# Patient Record
Sex: Female | Born: 1975 | Race: White | Hispanic: No | Marital: Single | Smoking: Never smoker
Health system: Southern US, Community
[De-identification: ages and names within clinical notes are randomized; demographics above are authoritative.]

## PROBLEM LIST (undated history)

## (undated) DIAGNOSIS — E7800 Pure hypercholesterolemia, unspecified: Secondary | ICD-10-CM

## (undated) DIAGNOSIS — F39 Unspecified mood [affective] disorder: Secondary | ICD-10-CM

## (undated) DIAGNOSIS — N943 Premenstrual tension syndrome: Secondary | ICD-10-CM

## (undated) DIAGNOSIS — F79 Unspecified intellectual disabilities: Secondary | ICD-10-CM

## (undated) DIAGNOSIS — E119 Type 2 diabetes mellitus without complications: Secondary | ICD-10-CM

---

## 2001-11-16 ENCOUNTER — Encounter: Admission: RE | Admit: 2001-11-16 | Discharge: 2002-02-14 | Payer: Self-pay

## 2017-01-16 ENCOUNTER — Emergency Department (HOSPITAL_BASED_OUTPATIENT_CLINIC_OR_DEPARTMENT_OTHER): Payer: Medicare Other

## 2017-01-16 ENCOUNTER — Encounter (HOSPITAL_BASED_OUTPATIENT_CLINIC_OR_DEPARTMENT_OTHER): Payer: Self-pay | Admitting: Emergency Medicine

## 2017-01-16 ENCOUNTER — Emergency Department (HOSPITAL_BASED_OUTPATIENT_CLINIC_OR_DEPARTMENT_OTHER)
Admission: EM | Admit: 2017-01-16 | Discharge: 2017-01-16 | Disposition: A | Discharge status: Home / Self Care | Payer: Medicare Other | Attending: Emergency Medicine | Admitting: Emergency Medicine

## 2017-01-16 DIAGNOSIS — S8391XA Sprain of unspecified site of right knee, initial encounter: Secondary | ICD-10-CM | POA: Insufficient documentation

## 2017-01-16 DIAGNOSIS — Y9389 Activity, other specified: Secondary | ICD-10-CM | POA: Diagnosis not present

## 2017-01-16 DIAGNOSIS — F79 Unspecified intellectual disabilities: Secondary | ICD-10-CM | POA: Insufficient documentation

## 2017-01-16 DIAGNOSIS — E119 Type 2 diabetes mellitus without complications: Secondary | ICD-10-CM | POA: Insufficient documentation

## 2017-01-16 DIAGNOSIS — Y998 Other external cause status: Secondary | ICD-10-CM | POA: Diagnosis not present

## 2017-01-16 DIAGNOSIS — Z79899 Other long term (current) drug therapy: Secondary | ICD-10-CM | POA: Insufficient documentation

## 2017-01-16 DIAGNOSIS — F39 Unspecified mood [affective] disorder: Secondary | ICD-10-CM | POA: Insufficient documentation

## 2017-01-16 DIAGNOSIS — Y929 Unspecified place or not applicable: Secondary | ICD-10-CM | POA: Insufficient documentation

## 2017-01-16 DIAGNOSIS — Z7984 Long term (current) use of oral hypoglycemic drugs: Secondary | ICD-10-CM | POA: Insufficient documentation

## 2017-01-16 DIAGNOSIS — S8991XA Unspecified injury of right lower leg, initial encounter: Secondary | ICD-10-CM | POA: Diagnosis present

## 2017-01-16 DIAGNOSIS — X509XXA Other and unspecified overexertion or strenuous movements or postures, initial encounter: Secondary | ICD-10-CM | POA: Insufficient documentation

## 2017-01-16 HISTORY — DX: Unspecified intellectual disabilities: F79

## 2017-01-16 HISTORY — DX: Unspecified mood (affective) disorder: F39

## 2017-01-16 HISTORY — DX: Premenstrual tension syndrome: N94.3

## 2017-01-16 HISTORY — DX: Type 2 diabetes mellitus without complications: E11.9

## 2017-01-16 HISTORY — DX: Pure hypercholesterolemia, unspecified: E78.00

## 2017-01-16 MED ORDER — HYDROCODONE-ACETAMINOPHEN 5-325 MG PO TABS
2.0000 | ORAL_TABLET | Freq: Once | ORAL | Status: AC
  Administered 2017-01-16: 2 via ORAL
  Filled 2017-01-16: qty 2

## 2017-01-16 NOTE — Discharge Instructions (Signed)
Use Tylenol every 4 hours as needed for pain. Use the walker as needed to help with nonweightbearing. Call Dr. Pearletha Forge to schedule appointment if having significant pain or difficulty walking in 4 or 5 days

## 2017-01-16 NOTE — ED Provider Notes (Signed)
[facility]   CSN: [id number] Arrival date & time: 01/16/17  1156   Level V caveat patient mentally challenged history is obtained from patient and from patient's parents who accompany her  History   Chief Complaint Chief Complaint  Patient presents with  . Knee Pain    HPI Shelia Lin is a 41 y.o. female. Patient injured her right knee yesterday while getting out of a van. She did not fall injury was a twisting injury. She has pain at right knee which is nonradiating which is worse with walking and improved with nonweightbearing. No treatment prior to coming here. No other associated symptoms. No other injury HPI  Past Medical History:  Diagnosis Date  . Diabetes mellitus without complication (HCC)   . Hypercholesteremia   . Mentally disabled   . Mood disorder (HCC)   . PMS (premenstrual syndrome)     There are no active problems to display for this patient.   History reviewed. No pertinent surgical history.  OB History    No data available       Home Medications    Prior to Admission medications   Medication Sig Start Date End Date Taking? Authorizing Provider  dapagliflozin propanediol (FARXIGA) 10 MG TABS tablet Take 10 mg by mouth daily.   Yes [provider]  dicyclomine (BENTYL) 10 MG capsule Take 10 mg by mouth 4 (four) times daily -  before meals and at bedtime.   Yes [provider]  glucose blood test strip 1 each by Other route as needed for other. Use as instructed   Yes [provider]  liraglutide (VICTOZA) 18 MG/3ML SOPN Inject into the skin.   Yes [provider]  metFORMIN (GLUCOPHAGE) 1000 MG tablet Take 1,000 mg by mouth 2 (two) times daily with a meal.   Yes [provider]  OXcarbazepine (TRILEPTAL) 150 MG tablet Take 150 mg by mouth 2 (two) times daily.   Yes [provider]  pioglitazone (ACTOS) 15 MG tablet Take 15 mg by mouth daily.   Yes [provider]  rosuvastatin (CRESTOR) 10 MG tablet Take 10 mg by mouth daily.   Yes [provider]  sertraline (ZOLOFT) 100 MG tablet Take 100 mg by mouth daily.   Yes [provider]    Family History History reviewed. No pertinent family history.  Social History Social History  Substance Use Topics  . Smoking status: Never Smoker  . Smokeless tobacco: Never Used  . Alcohol use No     Allergies   Patient has no known allergies.   Review of Systems Review of Systems  Unable to perform ROS: Other  Musculoskeletal: Positive for arthralgias.       Right knee pain  Mentally disabled   Physical Exam Updated Vital Signs BP 126/83 (BP Location: Left Arm)   Pulse 79   Temp 98.5 F (36.9 C) (Oral)   Resp 18   Ht  (1.6 m)   Wt 109.3 kg (241 lb)   SpO2 94%   BMI 42.69 kg/m   Physical Exam  Constitutional: She appears well-developed and well-nourished.  HENT:  Head: Normocephalic and atraumatic.  Eyes: Pupils are equal, round, and reactive to light. Conjunctivae are normal.  Neck: Neck supple. No tracheal deviation present. No thyromegaly present.  Cardiovascular: Normal rate and regular rhythm.   No murmur heard. Pulmonary/Chest: Effort normal and breath sounds normal.  Abdominal: Soft. Bowel sounds are normal. She exhibits no distension. There  is no tenderness.  Obese  Musculoskeletal: Normal range of motion. She exhibits no edema or tenderness.  Right lower extremity skin intact minimally swollen at anterior knee. No Lachman sign no posterior drawer sign. No collateral weakness. No redness or warmth. DP pulse 2+ good capillary refill all other extremities are redness swelling or tenderness neurovascularly intact  Neurological: She is alert. Coordination normal.  Walks with minimal limp favoring right lower extremity  Skin: Skin is warm and dry. No rash noted.  Psychiatric: She has a normal mood and affect.  Nursing note and vitals  reviewed.    ED Treatments / Results  Labs (all labs ordered are listed, but only abnormal results are displayed) Labs Reviewed - No data to display  EKG  EKG Interpretation None       Radiology Dg Knee Complete 4 Views Right  Result Date: 01/16/2017 CLINICAL DATA:  Fall yesterday. Pain over anterior knee with swelling. Initial encounter. EXAM: RIGHT KNEE - COMPLETE 4+ VIEW COMPARISON:  None. FINDINGS: Probable small joint effusion. No acute fracture or malalignment. Tricompartmental degenerative marginal spurring with probable medial compartment narrowing. IMPRESSION: 1. Probable small joint effusion.  No acute osseous finding. 2. Tricompartmental degenerative spurring. Electronically Signed   By: Marnee Spring M.D.   On: 01/16/2017 13:30    Procedures Procedures (including critical care time)  Medications Ordered in ED Medications  HYDROcodone-acetaminophen (NORCO/VICODIN) 5-325 MG per tablet 2 tablet (2 tablets Oral Given 01/16/17 1300)    X-rays viewed by me No results found for this or any previous visit. Dg Knee Complete 4 Views Right  Result Date: 01/16/2017 CLINICAL DATA:  Fall yesterday. Pain over anterior knee with swelling. Initial encounter. EXAM: RIGHT KNEE - COMPLETE 4+ VIEW COMPARISON:  None. FINDINGS: Probable small joint effusion. No acute fracture or malalignment. Tricompartmental degenerative marginal spurring with probable medial compartment narrowing. IMPRESSION: 1. Probable small joint effusion.  No acute osseous finding. 2. Tricompartmental degenerative spurring. Electronically Signed   By: Marnee Spring M.D.   On: 01/16/2017 13:30   Initial Impression / Assessment and Plan / ED Course  I have reviewed the triage vital signs and the nursing notes.  Pertinent labs & imaging results that were available during my care of the patient were reviewed by me and considered in my medical decision making (see chart for details).     3:20 PM feels somewhat  improved after treatment with Norco. Plan Tylenol for pain referral Dr. Pearletha Forge. Patient has a walker available to her at home which she can use as needed  Final Clinical Impressions(s) / ED Diagnoses  Diagnosis right knee sprain Final diagnoses:  None    New Prescriptions New Prescriptions   No medications on file     Doug Sou, MD 01/16/17 1534

## 2017-01-16 NOTE — ED Triage Notes (Signed)
Patient slipped out of the car yesterday and hurt her right knee. PArtents report that she has swelling to her right knee. Swelling is noted. Patient is [facility]

## 2017-01-16 NOTE — ED Notes (Signed)
Pt educated about not driving or performing other critical tasks (such as operating heavy machinery, caring for infant/toddler/child) due to sedative nature of medications received in ED. Also warned about risks of consuming alcohol or taking other medications with sedative properties. Pt/caregiver verbalized understanding.  

## 2017-10-23 ENCOUNTER — Emergency Department (HOSPITAL_BASED_OUTPATIENT_CLINIC_OR_DEPARTMENT_OTHER)
Admission: EM | Admit: 2017-10-23 | Discharge: 2017-10-23 | Disposition: A | Discharge status: Home / Self Care | Payer: Medicare Other | Attending: Emergency Medicine | Admitting: Emergency Medicine

## 2017-10-23 ENCOUNTER — Encounter (HOSPITAL_BASED_OUTPATIENT_CLINIC_OR_DEPARTMENT_OTHER): Payer: Self-pay | Admitting: *Deleted

## 2017-10-23 ENCOUNTER — Other Ambulatory Visit: Payer: Self-pay

## 2017-10-23 DIAGNOSIS — Z7984 Long term (current) use of oral hypoglycemic drugs: Secondary | ICD-10-CM | POA: Insufficient documentation

## 2017-10-23 DIAGNOSIS — H5711 Ocular pain, right eye: Secondary | ICD-10-CM | POA: Diagnosis present

## 2017-10-23 DIAGNOSIS — Z79899 Other long term (current) drug therapy: Secondary | ICD-10-CM | POA: Diagnosis not present

## 2017-10-23 DIAGNOSIS — L03213 Periorbital cellulitis: Secondary | ICD-10-CM | POA: Diagnosis not present

## 2017-10-23 DIAGNOSIS — E119 Type 2 diabetes mellitus without complications: Secondary | ICD-10-CM | POA: Diagnosis not present

## 2017-10-23 MED ORDER — CLINDAMYCIN HCL 300 MG PO CAPS: 300.0000 mg | ORAL_CAPSULE | Freq: Three times a day (TID) | ORAL | 0 refills | Status: DC

## 2017-10-23 NOTE — ED Triage Notes (Signed)
Pt has a history of mental disability. Mom at bedside to help answer questions. Swelling noted to her eye that started yesterday. Redness noted between her eyes and swelling to her right eye. Denies fevers. No other complaints. Denies any new meds, foods, products. Denies vision issues.

## 2017-10-23 NOTE — ED Provider Notes (Signed)
[facility] EMERGENCY DEPARTMENT Provider Note   CSN: [id number] Arrival date & time: 10/23/17  1230     History   Chief Complaint Chief Complaint  Patient presents with  . Facial Swelling    HPI Shelia Lin is a 42 y.o. female.  Patient is a 42 year old female with a history of mental disability, diabetes and hypercholesterolemia who is presenting today with swelling of her right eye.  Mom and dad state that earlier this week she complained of some pain anterior to her ear on the right but today they noticed redness and swelling of her right eyelid.  She has had no fever or systemic symptoms.  No prior history of the same.  She states that it hurts a little bit but does not have any pain when she moves her eye and does not have any change in her vision.  The history is provided by the patient and a parent.    Past Medical History:  Diagnosis Date  . Diabetes mellitus without complication (HCC)   . Hypercholesteremia   . Mentally disabled   . Mood disorder (HCC)   . PMS (premenstrual syndrome)     There are no active problems to display for this patient.   History reviewed. No pertinent surgical history.   OB History   None      Home Medications    Prior to Admission medications   Medication Sig Start Date End Date Taking? Authorizing Provider  clindamycin (CLEOCIN) 300 MG capsule Take 1 capsule (300 mg total) by mouth 3 (three) times daily. 10/23/17   Gwyneth Sprout, MD  dapagliflozin propanediol (FARXIGA) 10 MG TABS tablet Take 10 mg by mouth daily.    [provider]  dicyclomine (BENTYL) 10 MG capsule Take 10 mg by mouth 4 (four) times daily -  before meals and at bedtime.    [provider]  glucose blood test strip 1 each by Other route as needed for other. Use as instructed    [provider]  liraglutide (VICTOZA) 18 MG/3ML SOPN Inject into the skin.    [provider]  metFORMIN (GLUCOPHAGE) 1000 MG  tablet Take 1,000 mg by mouth 2 (two) times daily with a meal.    [provider]  OXcarbazepine (TRILEPTAL) 150 MG tablet Take 150 mg by mouth 2 (two) times daily.    [provider]  pioglitazone (ACTOS) 15 MG tablet Take 15 mg by mouth daily.    [provider]  rosuvastatin (CRESTOR) 10 MG tablet Take 10 mg by mouth daily.    [provider]  sertraline (ZOLOFT) 100 MG tablet Take 100 mg by mouth daily.    [provider]    Family History No family history on file.  Social History Social History   Tobacco Use  . Smoking status: Never Smoker  . Smokeless tobacco: Never Used  Substance Use Topics  . Alcohol use: No  . Drug use: No     Allergies   Patient has no known allergies.   Review of Systems Review of Systems  All other systems reviewed and are negative.    Physical Exam Updated Vital Signs BP 120/76 (BP Location: Right Arm)   Pulse 89   Temp 98.6 F (37 C) (Oral)   Resp 17   SpO2 95%   Physical Exam  Constitutional: She is oriented to person, place, and time. She appears well-developed and well-nourished. No distress.  HENT:  Head: Normocephalic and atraumatic.  Mouth/Throat:  Oropharynx is clear and moist.  Her tissue present over the right TM but no other acute findings.  No dental tenderness or gum erythema or swelling.  Eyes: Pupils are equal, round, and reactive to light. Conjunctivae and EOM are normal. Right eye exhibits no chemosis, no discharge, no exudate and no hordeolum. Right conjunctiva is not injected. Right conjunctiva has no hemorrhage.  Normal extraocular movements without pain.  Swelling and erythema present of the right upper eyelid and supraorbital area.  No palpable fluctuance, induration or drainage noted.  No preauricular nodes present  Neck: Normal range of motion. Neck supple.  Cardiovascular: Normal rate and intact distal pulses.  Pulmonary/Chest: Effort normal. No respiratory distress.   Musculoskeletal: Normal range of motion. She exhibits no edema or tenderness.  Neurological: She is alert and oriented to person, place, and time.  Skin: Skin is warm and dry. No rash noted. No erythema.  Psychiatric: She has a normal mood and affect. Her behavior is normal.  Nursing note and vitals reviewed.    ED Treatments / Results  Labs (all labs ordered are listed, but only abnormal results are displayed) Labs Reviewed - No data to display  EKG None  Radiology No results found.  Procedures Procedures (including critical care time)  Medications Ordered in ED Medications - No data to display   Initial Impression / Assessment and Plan / ED Course  I have reviewed the triage vital signs and the nursing notes.  Pertinent labs & imaging results that were available during my care of the patient were reviewed by me and considered in my medical decision making (see chart for details).     Patient presenting with symptoms consistent with a preseptal cellulitis.  She is able to move her eye without pain or difficulty and low suspicion for orbital cellulitis.  She has no systemic symptoms.  At this time will treat with clindamycin and given follow-up to return on Monday for recheck if symptoms are still persistent.  Final Clinical Impressions(s) / ED Diagnoses   Final diagnoses:  Preseptal cellulitis of right eye    ED Discharge Orders        Ordered    clindamycin (CLEOCIN) 300 MG capsule  3 times daily     10/23/17 1339       Gwyneth SproutPlunkett, Dulcy Sida, MD 10/23/17 1345

## 2017-10-25 ENCOUNTER — Emergency Department (HOSPITAL_BASED_OUTPATIENT_CLINIC_OR_DEPARTMENT_OTHER)
Admission: EM | Admit: 2017-10-25 | Discharge: 2017-10-25 | Disposition: A | Discharge status: Home / Self Care | Payer: Medicare Other | Attending: Emergency Medicine | Admitting: Emergency Medicine

## 2017-10-25 ENCOUNTER — Other Ambulatory Visit: Payer: Self-pay

## 2017-10-25 ENCOUNTER — Encounter (HOSPITAL_BASED_OUTPATIENT_CLINIC_OR_DEPARTMENT_OTHER): Payer: Self-pay | Admitting: Emergency Medicine

## 2017-10-25 ENCOUNTER — Emergency Department (HOSPITAL_BASED_OUTPATIENT_CLINIC_OR_DEPARTMENT_OTHER): Payer: Medicare Other

## 2017-10-25 DIAGNOSIS — H02843 Edema of right eye, unspecified eyelid: Secondary | ICD-10-CM | POA: Diagnosis present

## 2017-10-25 DIAGNOSIS — H05011 Cellulitis of right orbit: Secondary | ICD-10-CM | POA: Diagnosis not present

## 2017-10-25 DIAGNOSIS — E119 Type 2 diabetes mellitus without complications: Secondary | ICD-10-CM | POA: Diagnosis not present

## 2017-10-25 DIAGNOSIS — L03213 Periorbital cellulitis: Secondary | ICD-10-CM

## 2017-10-25 DIAGNOSIS — Z79899 Other long term (current) drug therapy: Secondary | ICD-10-CM | POA: Insufficient documentation

## 2017-10-25 DIAGNOSIS — Z7984 Long term (current) use of oral hypoglycemic drugs: Secondary | ICD-10-CM | POA: Diagnosis not present

## 2017-10-25 LAB — CBC WITH DIFFERENTIAL/PLATELET
Basophils Absolute: 0 10*3/uL (ref 0.0–0.1)
Basophils Relative: 1 %
EOS PCT: 2 %
Eosinophils Absolute: 0.1 10*3/uL (ref 0.0–0.7)
HEMATOCRIT: 46.8 % — AB (ref 36.0–46.0)
Hemoglobin: 15.1 g/dL — ABNORMAL HIGH (ref 12.0–15.0)
LYMPHS ABS: 1.7 10*3/uL (ref 0.7–4.0)
Lymphocytes Relative: 27 %
MCH: 28.8 pg (ref 26.0–34.0)
MCHC: 32.3 g/dL (ref 30.0–36.0)
MCV: 89.3 fL (ref 78.0–100.0)
Monocytes Absolute: 0.5 10*3/uL (ref 0.1–1.0)
Monocytes Relative: 8 %
NEUTROS ABS: 3.8 10*3/uL (ref 1.7–7.7)
Neutrophils Relative %: 62 %
PLATELETS: 187 10*3/uL (ref 150–400)
RBC: 5.24 MIL/uL — AB (ref 3.87–5.11)
RDW: 15.3 % (ref 11.5–15.5)
WBC: 6.1 10*3/uL (ref 4.0–10.5)

## 2017-10-25 LAB — I-STAT CHEM 8, ED
BUN: 11 mg/dL (ref 6–20)
CHLORIDE: 99 mmol/L (ref 98–111)
CREATININE: 0.5 mg/dL (ref 0.44–1.00)
Calcium, Ion: 1.2 mmol/L (ref 1.15–1.40)
GLUCOSE: 149 mg/dL — AB (ref 70–99)
HEMATOCRIT: 46 % (ref 36.0–46.0)
Hemoglobin: 15.6 g/dL — ABNORMAL HIGH (ref 12.0–15.0)
POTASSIUM: 4 mmol/L (ref 3.5–5.1)
Sodium: 141 mmol/L (ref 135–145)
TCO2: 31 mmol/L (ref 22–32)

## 2017-10-25 LAB — PREGNANCY, URINE: Preg Test, Ur: NEGATIVE

## 2017-10-25 MED ORDER — CLINDAMYCIN HCL 150 MG PO CAPS: 450.0000 mg | ORAL_CAPSULE | Freq: Three times a day (TID) | ORAL | 0 refills | Status: DC

## 2017-10-25 MED ORDER — IOPAMIDOL (ISOVUE-300) INJECTION 61%
100.0000 mL | Freq: Once | INTRAVENOUS | Status: AC | PRN
  Administered 2017-10-25: 80 mL via INTRAVENOUS

## 2017-10-25 NOTE — ED Provider Notes (Signed)
[facility] EMERGENCY DEPARTMENT Provider Note   CSN: [id number] Arrival date & time: 10/25/17  0846     History   Chief Complaint Chief Complaint  Patient presents with  . Wound Check    HPI Shelia Lin is a 42 y.o. female with a hx of mental disability, DM, and hypercholesterolemia who returns to the ED for mildly worsened R eye swelling/erythema since last ED visit two days prior. History is provided by patient and her father at bedside. Patient states that she developed some discomfort to the area just anterior to the R ear about 6 days ago, then about 3 days ago she developed swelling/redness to the R periorbital area which spread to central face, she does have some redness to the face at baseline. She was seen in the ED 07/20- diagnosed with R eye periorbital cellulitis and started on Clindamycin 300 mg TID. She has been taking medications as prescribed. Her and her father feel that the area medial to the R eye has become a bit more red/swollen, not substantially different. Patient has mild discomfort to the eye, not specifically with EOM. She states that she may have some mild vision trouble in that eye as well, but states it is hard to have it open all the way due to swelling. No specific alleviating/aggravating factors. Not taking other medications for this. Denies fever, chills, nausea, vomiting, ear pain, or sore throat. Patient is not a contact lens wearer.   HPI  Past Medical History:  Diagnosis Date  . Diabetes mellitus without complication (HCC)   . Hypercholesteremia   . Mentally disabled   . Mood disorder (HCC)   . PMS (premenstrual syndrome)     There are no active problems to display for this patient.   No past surgical history on file.   OB History   None      Home Medications    Prior to Admission medications   Medication Sig Start Date End Date Taking? Authorizing Provider  clindamycin (CLEOCIN) 300 MG capsule Take 1 capsule (300 mg  total) by mouth 3 (three) times daily. 10/23/17   Gwyneth Sprout, MD  dapagliflozin propanediol (FARXIGA) 10 MG TABS tablet Take 10 mg by mouth daily.    [provider]  dicyclomine (BENTYL) 10 MG capsule Take 10 mg by mouth 4 (four) times daily -  before meals and at bedtime.    [provider]  glucose blood test strip 1 each by Other route as needed for other. Use as instructed    [provider]  liraglutide (VICTOZA) 18 MG/3ML SOPN Inject into the skin.    [provider]  metFORMIN (GLUCOPHAGE) 1000 MG tablet Take 1,000 mg by mouth 2 (two) times daily with a meal.    [provider]  OXcarbazepine (TRILEPTAL) 150 MG tablet Take 150 mg by mouth 2 (two) times daily.    [provider]  pioglitazone (ACTOS) 15 MG tablet Take 15 mg by mouth daily.    [provider]  rosuvastatin (CRESTOR) 10 MG tablet Take 10 mg by mouth daily.    [provider]  sertraline (ZOLOFT) 100 MG tablet Take 100 mg by mouth daily.    [provider]    Family History No family history on file.  Social History Social History   Tobacco Use  . Smoking status: Never Smoker  . Smokeless tobacco: Never Used  Substance Use Topics  . Alcohol use: No  . Drug use: No  Allergies   Patient has no known allergies.   Review of Systems Review of Systems  Constitutional: Negative for chills and fever.  HENT: Positive for facial swelling (and redness). Negative for congestion, ear pain, sore throat and trouble swallowing.   Eyes: Positive for visual disturbance.  Musculoskeletal: Negative for neck pain and neck stiffness.  All other systems reviewed and are negative.    Physical Exam Updated Vital Signs BP 126/86 (BP Location: Right Arm)   Pulse 85   Temp 98.3 F (36.8 C) (Oral)   Resp 18   Ht 5\' 3"  (1.6 m)   Wt 113.4 kg (250 lb)   SpO2 95%   BMI 44.29 kg/m   Physical Exam  Constitutional: She appears  well-developed and well-nourished.  Non-toxic appearance. No distress.  HENT:  Head: Normocephalic and atraumatic.  Right Ear: Tympanic membrane is not perforated, not erythematous, not retracted and not bulging.  Left Ear: Tympanic membrane is not perforated, not erythematous, not retracted and not bulging.  Nose: Nose normal.  Mouth/Throat: Uvula is midline and oropharynx is clear and moist. No oropharyngeal exudate or posterior oropharyngeal erythema.  Scarring present to bilateral TMs. No mastoid erythema/warmth/swelling.   Eyes: Pupils are equal, round, and reactive to light. Conjunctivae and EOM are normal. Right eye exhibits no discharge. Left eye exhibits no discharge.  No consensual photophobia on exam. Patient has swelling and erythema to the R facial area including the upper eyelide, the supraorbital area as well as the areas just medial and lateral to this as pictured below. No palpable fluctuance. No drainage. Not overly warm to touch.  No proptosis.  Neck: Normal range of motion. Neck supple.  Cardiovascular: Normal rate and regular rhythm.  No murmur heard. Pulmonary/Chest: Breath sounds normal. No respiratory distress. She has no wheezes. She has no rales.  Abdominal: Soft. She exhibits no distension. There is no tenderness.  Lymphadenopathy:       Head (right side): No preauricular and no posterior auricular adenopathy present.       Head (left side): No preauricular and no posterior auricular adenopathy present.    She has cervical adenopathy (mild right).  Neurological: She is alert.  Skin: Skin is warm and dry. No rash noted.  Psychiatric: She has a normal mood and affect. Her behavior is normal.  Nursing note and vitals reviewed.      ED Treatments / Results  Labs Results for orders placed or performed during the hospital encounter of 10/25/17  CBC with Differential  Result Value Ref Range   WBC 6.1 4.0 - 10.5 K/uL   RBC 5.24 (H) 3.87 - 5.11 MIL/uL   Hemoglobin  15.1 (H) 12.0 - 15.0 g/dL   HCT 11.946.8 (H) 14.736.0 - 82.946.0 %   MCV 89.3 78.0 - 100.0 fL   MCH 28.8 26.0 - 34.0 pg   MCHC 32.3 30.0 - 36.0 g/dL   RDW 56.215.3 13.011.5 - 86.515.5 %   Platelets 187 150 - 400 K/uL   Neutrophils Relative % 62 %   Neutro Abs 3.8 1.7 - 7.7 K/uL   Lymphocytes Relative 27 %   Lymphs Abs 1.7 0.7 - 4.0 K/uL   Monocytes Relative 8 %   Monocytes Absolute 0.5 0.1 - 1.0 K/uL   Eosinophils Relative 2 %   Eosinophils Absolute 0.1 0.0 - 0.7 K/uL   Basophils Relative 1 %   Basophils Absolute 0.0 0.0 - 0.1 K/uL  Pregnancy, urine  Result Value Ref Range   Preg Test, Ur  NEGATIVE NEGATIVE  I-stat Chem 8, ED (when main lab is down)  Result Value Ref Range   Sodium 141 135 - 145 mmol/L   Potassium 4.0 3.5 - 5.1 mmol/L   Chloride 99 98 - 111 mmol/L   BUN 11 6 - 20 mg/dL   Creatinine, Ser 1.32 0.44 - 1.00 mg/dL   Glucose, Bld 440 (H) 70 - 99 mg/dL   Calcium, Ion 1.02 1.15 - 1.40 mmol/L   TCO2 31 22 - 32 mmol/L   Hemoglobin 15.6 (H) 12.0 - 15.0 g/dL   HCT 72.5 36.6 - 44.0 %   EKG None  Radiology Ct Maxillofacial W Contrast  Result Date: 10/25/2017 CLINICAL DATA:  Facial swelling with redness and warmth for 5-6 days. EXAM: CT MAXILLOFACIAL WITH CONTRAST TECHNIQUE: Multidetector CT imaging of the maxillofacial structures was performed with intravenous contrast. Multiplanar CT image reconstructions were also generated. CONTRAST:  80mL ISOVUE-300 IOPAMIDOL (ISOVUE-300) INJECTION 61% COMPARISON:  None. FINDINGS: Osseous: Negative for fracture, erosion, or lesion. Orbits: No postseptal inflammation. Sinuses: Clear Soft tissues: Soft tissue swelling and fat stranding in the right face and anterior to the right orbit. No collection. No deep space source is seen. Prominence of nodes in the parotid and jugular stations, asymmetric to the right and compatible with reactive adenitis in this setting. Limited intracranial: Negative IMPRESSION: 1. Right facial and preseptal cellulitis without abscess or  deep space inflammation. 2. Right cervical adenitis. Electronically Signed   By: Marnee Spring M.D.   On: 10/25/2017 12:26    Procedures Procedures (including critical care time)  Medications Ordered in ED Medications - No data to display   Initial Impression / Assessment and Plan / ED Course  I have reviewed the triage vital signs and the nursing notes.  Pertinent labs & imaging results that were available during my care of the patient were reviewed by me and considered in my medical decision making (see chart for details).   Patient returns to the emergency department with her father for slightly worse facial swelling/erythema after diagnosis of right periorbital cellulitis and treatment with clindamycin (300 mg TID) started 2 days ago.  Patient's right lid, supraorbital area, and areas medial and lateral to this are swollen and erythematous.  Extraocular movements are intact.  Visual acuity is symmetric. No proptosis.  Afebrile. Suspicion for orbital cellulitis is somewhat low based on exam, however patient has returned without significant improvement will obtain CT maxillofacial with contrast and basic labs for further evaluation.  Labs reviewed: Grossly unremarkable.  No leukocytosis.  No anemia.  No significant electrolyte abnormalities.  Hyperglycemia at 149, history of diabetes mellitus. CT notable for 1. Right facial and preseptal cellulitis without abscess or deep space inflammation. 2. Right cervical adenitis.  Further discussed with patient and her father, they report that patient generally appears similar, only slight worsening medial to the eye.  We discussed options of increasing patient's dose of clindamycin outpatient from 300 mg 3 times daily to 450 mg 3 times daily versus hospitalization for IV antibiotics.  Risk/benefits discussed.  We had a patient's centered discussion with shared decision making- patient and her father elected for outpatient trial of increased abx dose and  close follow up. I discussed results, treatment plan, need for PCP follow-up, and return precautions with the patient and her father. Provided opportunity for questions, patient and her father confirmed understanding and are in agreement with plan.   Findings and plan of care discussed with supervising physician Dr. Madilyn Hook who personally evaluated  and examined this patient and is in agreement.     Final Clinical Impressions(s) / ED Diagnoses   Final diagnoses:  Periorbital cellulitis of right eye    ED Discharge Orders        Ordered    clindamycin (CLEOCIN) 150 MG capsule  3 times daily     10/25/17 [address] [place], PA-C 10/25/17 1836    Tilden Fossa, MD 10/26/17 [phone]

## 2017-10-25 NOTE — Discharge Instructions (Addendum)
You were seen in the emergency department for reevaluation of your right eye redness and swelling.  The CT scan that we did shows that you do have what is noticed periorbital or preseptal cellulitis.  We are increasing your antibiotic dose from 300 mg 3 times per day to 450 mg 3 times per day.  Is disregard your previous antibiotic, please take the clindamycin as prescribed with the prescription provided at today's visit.  Please take Motrin per over-the-counter dosing instructions for pain and swelling as well.  We would like you to be reevaluated within 24 to 48 hours should you not have improvement or should you have any worsening or spiking of fevers.  Return immediately should you be unable to move your eyes, have significant pain with movement of the eyes, feel your vision is blurry, or any other concerns that you may have.

## 2017-10-25 NOTE — ED Triage Notes (Signed)
Pt seen here for redness and swelling to right eye.  Family states it has gotten worse and here to have it re-evaluated.  Pt compliant with antibiotics.

## 2020-04-24 IMAGING — CT CT MAXILLOFACIAL W/ CM
3 of 4 series · 16 of 47 positions shown, 19 images · IV contrast (iopamidol)
Comparison: None.

CLINICAL DATA: Facial swelling with redness and warmth for 5-6
days.

EXAM:
CT MAXILLOFACIAL WITH CONTRAST
TECHNIQUE: Multidetector CT imaging of the maxillofacial structures was
performed with intravenous contrast. Multiplanar CT image
reconstructions were also generated.
CONTRAST:  80mL C7U7ZG-RII IOPAMIDOL ([id number]-RII) INJECTION 61%

[Series 2: max soft · axial · 0.38mm/px · z∈[-241,-93]mm · 12 of 86 slices shown, 15 images]
[im 6/86  brain]
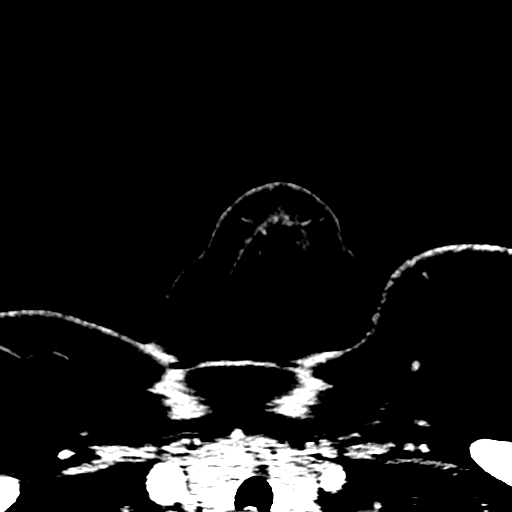
[im 6/86  bone]
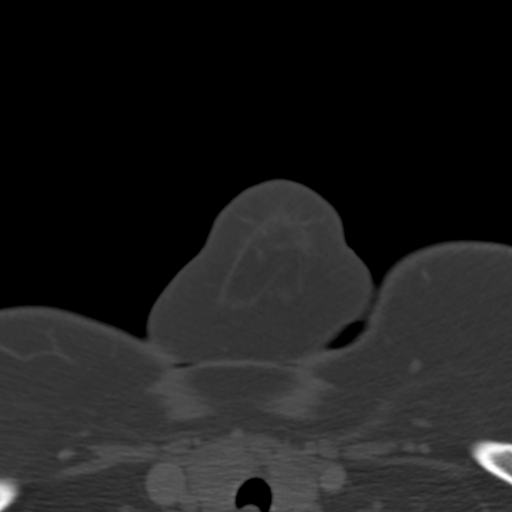
[im 12/86  bone]
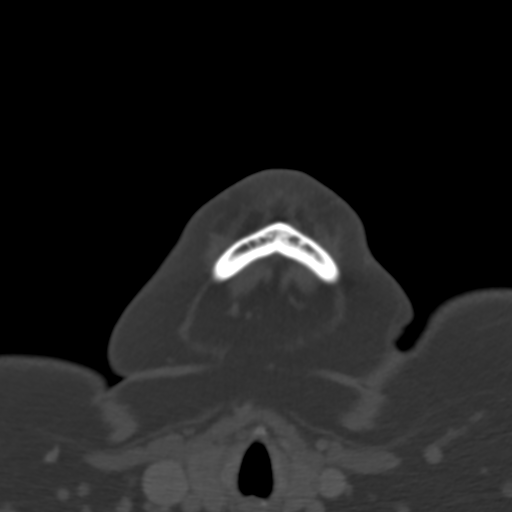
[im 18/86  bone]
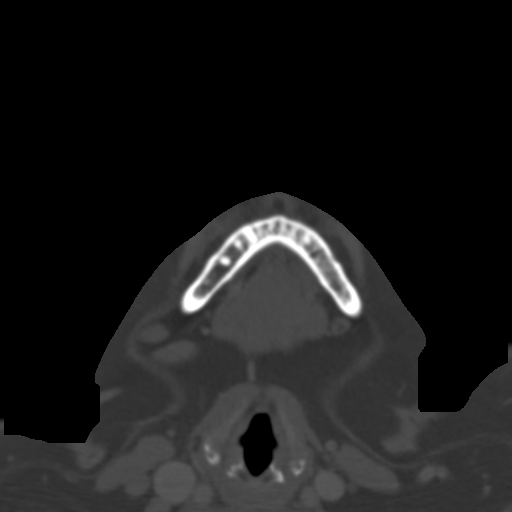
[im 27/86  bone]
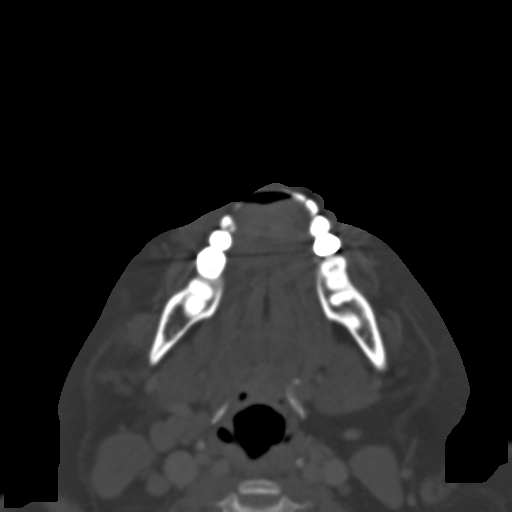
[im 33/86  brain]
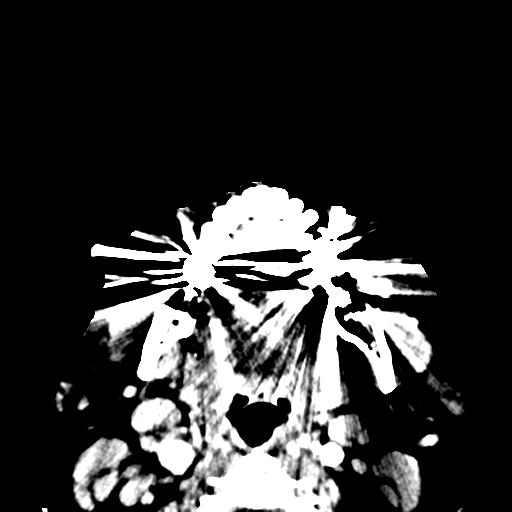
[im 33/86  bone]
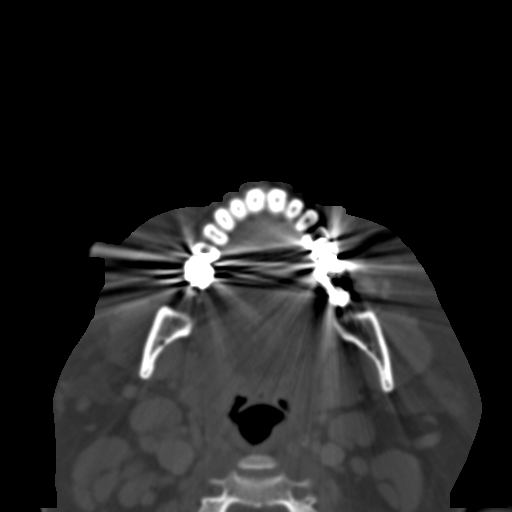
[im 39/86  bone]
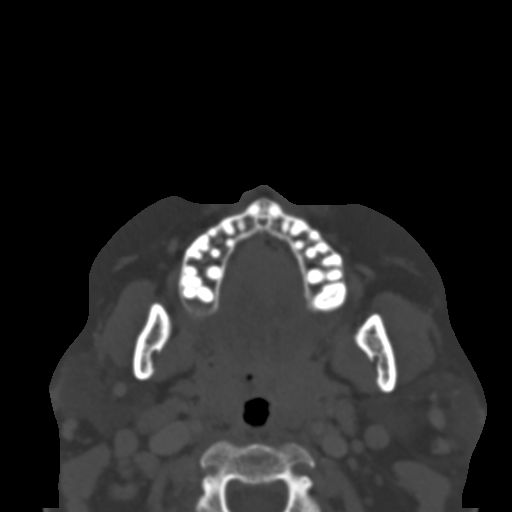
[im 47/86  bone]
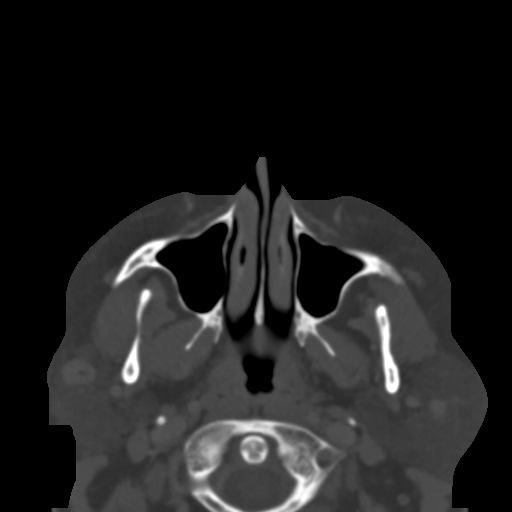
[im 53/86  bone]
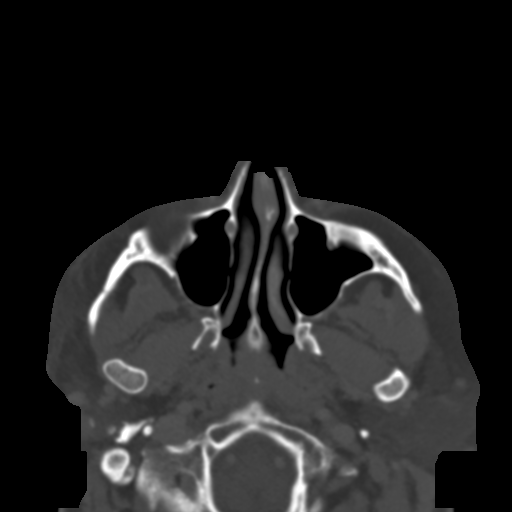
[im 59/86  brain]
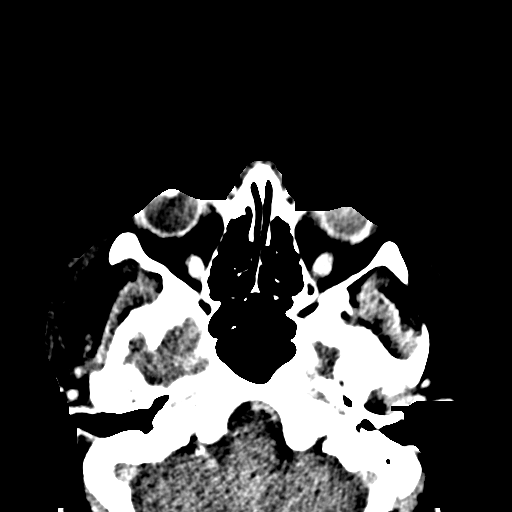
[im 59/86  bone]
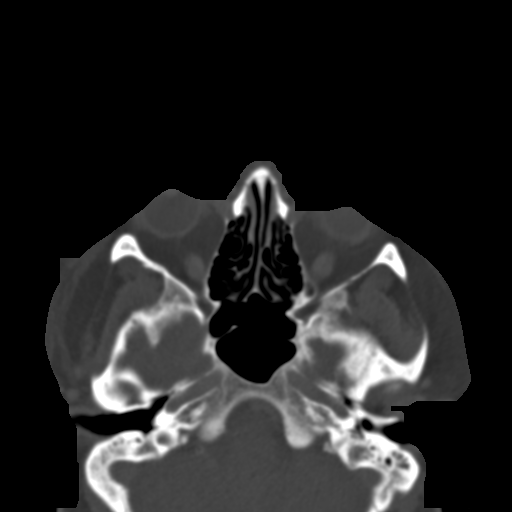
[im 68/86  bone]
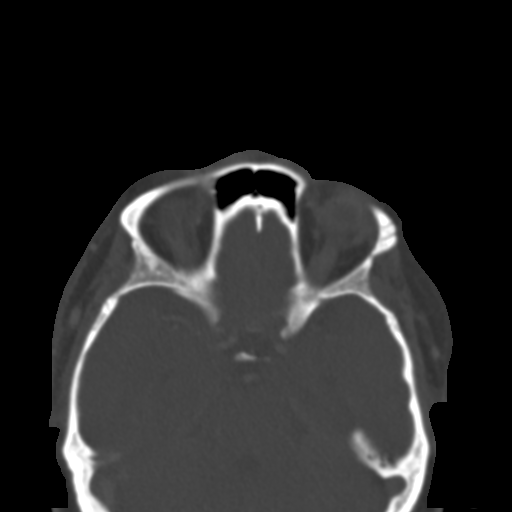
[im 74/86  bone]
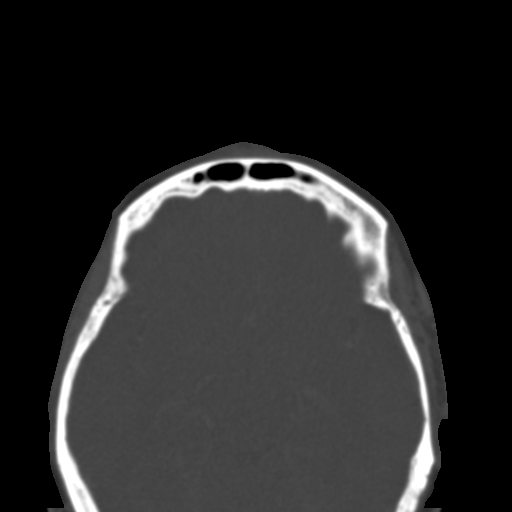
[im 80/86  bone]
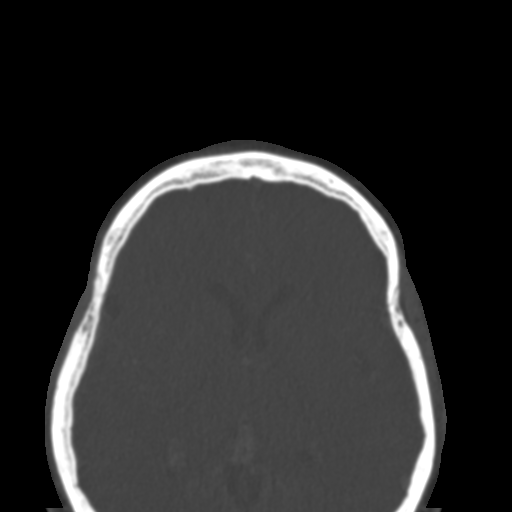

[Series 4: coronal soft · coronal · 0.35mm/px · 3 of 86 slices shown]
[im 29/86  bone]
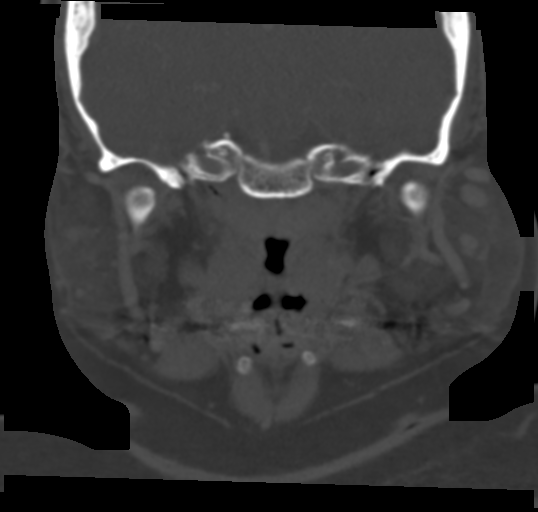
[im 38/86  bone]
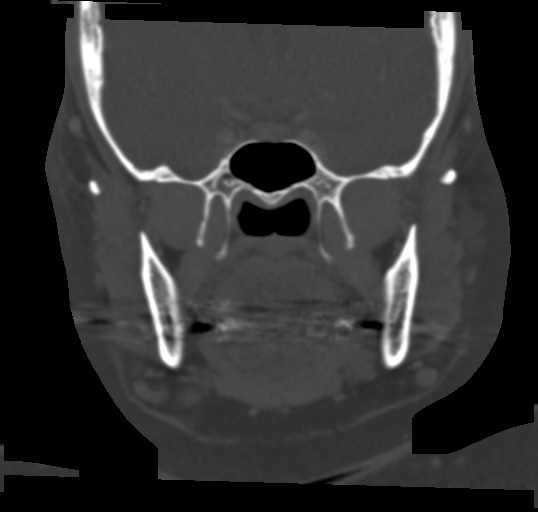
[im 48/86  bone]
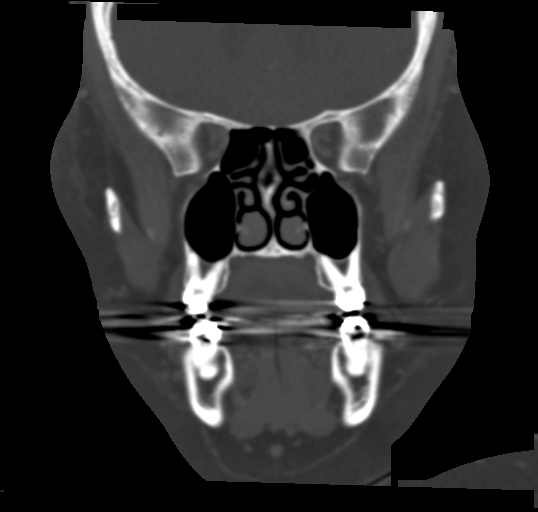

[Series 7: sagittal bone · sagittal · 0.35mm/px · 1 of 96 slices shown]
[im 48/96  bone]
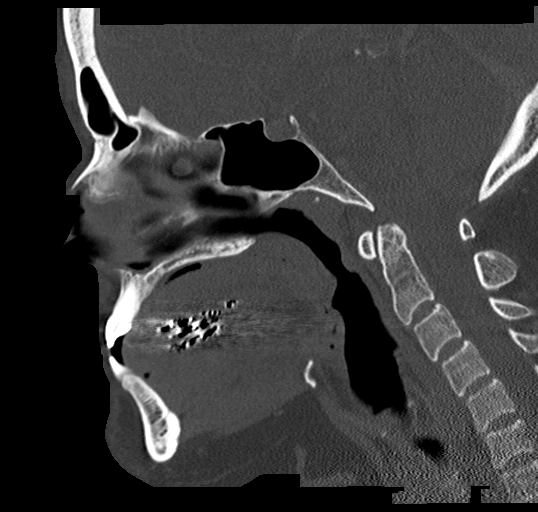

[16 of 47 positions shown; findings below may reference images not displayed]

FINDINGS: Osseous: Negative for fracture, erosion, or lesion.

Orbits: No postseptal inflammation.

Sinuses: Clear

Soft tissues: Soft tissue swelling and fat stranding in the right
face and anterior to the right orbit. No collection. No deep space
source is seen.

Prominence of nodes in the parotid and jugular stations, asymmetric
to the right and compatible with reactive adenitis in this setting.

Limited intracranial: Negative
IMPRESSION: 1. Right facial and preseptal cellulitis without abscess or deep
space inflammation.
2. Right cervical adenitis.

## 2022-11-15 ENCOUNTER — Encounter (HOSPITAL_BASED_OUTPATIENT_CLINIC_OR_DEPARTMENT_OTHER): Payer: Self-pay | Admitting: Emergency Medicine

## 2022-11-15 ENCOUNTER — Other Ambulatory Visit: Payer: Self-pay

## 2022-11-15 ENCOUNTER — Emergency Department (HOSPITAL_BASED_OUTPATIENT_CLINIC_OR_DEPARTMENT_OTHER)
Admission: EM | Admit: 2022-11-15 | Discharge: 2022-11-15 | Disposition: A | Discharge status: Home / Self Care | Payer: Medicare Other | Attending: Emergency Medicine | Admitting: Emergency Medicine

## 2022-11-15 DIAGNOSIS — H9202 Otalgia, left ear: Secondary | ICD-10-CM | POA: Diagnosis present

## 2022-11-15 DIAGNOSIS — Z7984 Long term (current) use of oral hypoglycemic drugs: Secondary | ICD-10-CM | POA: Insufficient documentation

## 2022-11-15 DIAGNOSIS — H60502 Unspecified acute noninfective otitis externa, left ear: Secondary | ICD-10-CM | POA: Diagnosis not present

## 2022-11-15 DIAGNOSIS — E119 Type 2 diabetes mellitus without complications: Secondary | ICD-10-CM | POA: Diagnosis not present

## 2022-11-15 MED ORDER — CIPROFLOXACIN-DEXAMETHASONE 0.3-0.1 % OT SUSP: 4.0000 [drp] | Freq: Once | OTIC | Status: DC

## 2022-11-15 MED ORDER — OFLOXACIN 0.3 % OP SOLN
10.0000 [drp] | Freq: Once | OPHTHALMIC | Status: AC
  Administered 2022-11-15: 10 [drp] via OTIC
  Filled 2022-11-15: qty 5

## 2022-11-15 NOTE — ED Triage Notes (Signed)
Pt with LT ear pain since Thur; not responding to OTC tx

## 2022-11-15 NOTE — Discharge Instructions (Addendum)
It was a pleasure taking care of you today!  You will be sent home with a medication called Ofloxacin. You will place 10 (Ten) drops into the LEFT ear once daily for 7 (seven) days.   Attached is information for the on-call ENT specialist, Dr. Ernestene Kiel to follow up as needed. Return to the ED if you are experiencing increasing/worsening headache, ear drainage/pain, fever, worsening symptoms.

## 2022-11-15 NOTE — ED Notes (Signed)
Discharge paperwork reviewed entirely with patient, including follow up care. Pain was under control. The patient received instruction and coaching on their prescriptions, and all follow-up questions were answered.  Pt verbalized understanding as well as all parties involved. No questions or concerns voiced at the time of discharge. No acute distress noted.   Pt ambulated out to PVA without incident or assistance.  

## 2022-11-15 NOTE — ED Provider Notes (Signed)
[place] [facility] Provider Note   CSN: [id number] Arrival date & time: 11/15/22  1039     History  Chief Complaint  Patient presents with   Otalgia    Shelia Lin is a 47 y.o. female with a past medical history of diabetes who presents emergency department concerns for left ear pain x 4 days.  Has tried over-the-counter earache relief drops.  Also tried hydrogen peroxide and alcohol to the ear.  Notes that she got water in her ear recently.  However denies recent swimming.  Denies ear drainage, fever, sore throat, runny nose, stuffy nose.  The history is provided by the patient and a relative. No language interpreter was used.       Home Medications Prior to Admission medications   Medication Sig Start Date End Date Taking? Authorizing Provider  clindamycin (CLEOCIN) 150 MG capsule Take 3 capsules (450 mg total) by mouth 3 (three) times daily. 10/25/17   Petrucelli, Samantha R, PA-C  dapagliflozin propanediol (FARXIGA) 10 MG TABS tablet Take 10 mg by mouth daily.    [provider]  dicyclomine (BENTYL) 10 MG capsule Take 10 mg by mouth 4 (four) times daily -  before meals and at bedtime.    [provider]  glucose blood test strip 1 each by Other route as needed for other. Use as instructed    [provider]  liraglutide (VICTOZA) 18 MG/3ML SOPN Inject into the skin.    [provider]  metFORMIN (GLUCOPHAGE) 1000 MG tablet Take 1,000 mg by mouth 2 (two) times daily with a meal.    [provider]  OXcarbazepine (TRILEPTAL) 150 MG tablet Take 150 mg by mouth 2 (two) times daily.    [provider]  pioglitazone (ACTOS) 15 MG tablet Take 15 mg by mouth daily.    [provider]  rosuvastatin (CRESTOR) 10 MG tablet Take 10 mg by mouth daily.    [provider]  sertraline (ZOLOFT) 100 MG tablet Take 100 mg by mouth daily.    [provider]      Allergies     Erythromycin    Review of Systems   Review of Systems  HENT:  Positive for ear pain.   All other systems reviewed and are negative.   Physical Exam Updated Vital Signs BP (!) 152/96   Pulse (!) 115   Temp 98.1 F (36.7 C) (Oral)   Resp 18   Ht 5\' 3"  (1.6 m)   Wt 98.4 kg   SpO2 93%   BMI 38.44 kg/m  Physical Exam Vitals and nursing note reviewed.  Constitutional:      General: She is not in acute distress.    Appearance: Normal appearance.  HENT:     Right Ear: Tympanic membrane, ear canal and external ear normal. No mastoid tenderness.     Left Ear: Drainage, swelling and tenderness present. No mastoid tenderness.     Ears:     Comments: Right TM unremarkable. Left canal edematous with white discharge noted to canal. No mastoid TTP noted bilaterally. No TTP noted to bilateral external ears or canals. Unable to fully visualize left TM.  Eyes:     General: No scleral icterus.    Extraocular Movements: Extraocular movements intact.  Cardiovascular:     Rate and Rhythm: Normal rate.  Pulmonary:     Effort: Pulmonary effort is normal. No respiratory distress.  Abdominal:     Palpations: Abdomen is soft.  There is no mass.     Tenderness: There is no abdominal tenderness.  Musculoskeletal:        General: Normal range of motion.     Cervical back: Neck supple.  Skin:    General: Skin is warm and dry.     Findings: No rash.  Neurological:     Mental Status: She is alert.     Sensory: Sensation is intact.     Motor: Motor function is intact.  Psychiatric:        Behavior: Behavior normal.     ED Results / Procedures / Treatments   Labs (all labs ordered are listed, but only abnormal results are displayed) Labs Reviewed - No data to display  EKG None  Radiology No results found.  Procedures Procedures    Medications Ordered in ED Medications  ciprofloxacin-dexamethasone (CIPRODEX) 0.3-0.1 % OTIC (EAR) suspension 4 drop (has no administration in time  range)    ED Course/ Medical Decision Making/ A&P                                 Medical Decision Making Risk Prescription drug management.   Patient presents with left ear pain onset 4 days.  Denies sick contacts.  Vital signs pt afebrile.  On exam patient with Right TM unremarkable. Left canal edematous with white discharge noted to canal. No mastoid TTP noted bilaterally. No TTP noted to bilateral external ears or canals. Unable to fully visualize left TM. Differential diagnosis includes otitis media, otitis externa, acute mastoiditis, malignant otitis externa.    Additional history obtained:  Additional history obtained from Caregiver  Medications:  I ordered medication including ofloxacin drops for symptom management I have reviewed the patients home medicines and have made adjustments as needed    Disposition: Patient presentation suspicious for otitis externa. Doubt otitis media, acute mastoiditis, or malignant otitis externa at this time. After consideration of the diagnostic results and the patients response to treatment, I feel that the patient would benefit from Discharge home. Discussed with patient and caregiver at bedside regarding plans for treatment with drops and ENT follow up given. Advised patient to follow-up with primary care provider.  Supportive care measures and strict return precautions discussed with patient and caregiver at bedside. Pt and caregiver acknowledges and verbalizes understanding. Pt appears safe for discharge. Follow up as indicated in discharge paperwork.    This chart was dictated using voice recognition software, Dragon. Despite the best efforts of this provider to proofread and correct errors, errors may still occur which can change documentation meaning.   Final Clinical Impression(s) / ED Diagnoses Final diagnoses:  Acute otitis externa of left ear, unspecified type    Rx / DC Orders ED Discharge Orders     None         Jaasiel Hollyfield,  Cheyeanne Roadcap A, PA-C 11/15/22 1412    Laurence Spates, MD 11/16/22 1416

## 2023-04-04 ENCOUNTER — Emergency Department (HOSPITAL_BASED_OUTPATIENT_CLINIC_OR_DEPARTMENT_OTHER): Payer: Medicare Other

## 2023-04-04 ENCOUNTER — Encounter (HOSPITAL_BASED_OUTPATIENT_CLINIC_OR_DEPARTMENT_OTHER): Payer: Self-pay | Admitting: Emergency Medicine

## 2023-04-04 ENCOUNTER — Other Ambulatory Visit: Payer: Self-pay

## 2023-04-04 ENCOUNTER — Inpatient Hospital Stay (HOSPITAL_BASED_OUTPATIENT_CLINIC_OR_DEPARTMENT_OTHER)
Admission: EM | Admit: 2023-04-04 | Discharge: 2023-04-10 | DRG: 871 | Disposition: A | Discharge status: Home / Self Care | Payer: Medicare Other | Attending: Internal Medicine | Admitting: Internal Medicine

## 2023-04-04 DIAGNOSIS — K219 Gastro-esophageal reflux disease without esophagitis: Secondary | ICD-10-CM | POA: Diagnosis present

## 2023-04-04 DIAGNOSIS — G9341 Metabolic encephalopathy: Secondary | ICD-10-CM | POA: Diagnosis present

## 2023-04-04 DIAGNOSIS — K3184 Gastroparesis: Secondary | ICD-10-CM | POA: Diagnosis present

## 2023-04-04 DIAGNOSIS — E1143 Type 2 diabetes mellitus with diabetic autonomic (poly)neuropathy: Secondary | ICD-10-CM | POA: Diagnosis present

## 2023-04-04 DIAGNOSIS — Z7984 Long term (current) use of oral hypoglycemic drugs: Secondary | ICD-10-CM | POA: Diagnosis not present

## 2023-04-04 DIAGNOSIS — G8929 Other chronic pain: Secondary | ICD-10-CM | POA: Diagnosis present

## 2023-04-04 DIAGNOSIS — F39 Unspecified mood [affective] disorder: Secondary | ICD-10-CM | POA: Diagnosis present

## 2023-04-04 DIAGNOSIS — R4182 Altered mental status, unspecified: Secondary | ICD-10-CM | POA: Diagnosis not present

## 2023-04-04 DIAGNOSIS — Z6836 Body mass index (BMI) 36.0-36.9, adult: Secondary | ICD-10-CM

## 2023-04-04 DIAGNOSIS — E1110 Type 2 diabetes mellitus with ketoacidosis without coma: Secondary | ICD-10-CM | POA: Diagnosis present

## 2023-04-04 DIAGNOSIS — E876 Hypokalemia: Secondary | ICD-10-CM

## 2023-04-04 DIAGNOSIS — E7800 Pure hypercholesterolemia, unspecified: Secondary | ICD-10-CM | POA: Diagnosis present

## 2023-04-04 DIAGNOSIS — Z794 Long term (current) use of insulin: Secondary | ICD-10-CM

## 2023-04-04 DIAGNOSIS — G9340 Encephalopathy, unspecified: Secondary | ICD-10-CM

## 2023-04-04 DIAGNOSIS — R0603 Acute respiratory distress: Secondary | ICD-10-CM | POA: Diagnosis present

## 2023-04-04 DIAGNOSIS — A419 Sepsis, unspecified organism: Principal | ICD-10-CM | POA: Diagnosis present

## 2023-04-04 DIAGNOSIS — Z79899 Other long term (current) drug therapy: Secondary | ICD-10-CM

## 2023-04-04 DIAGNOSIS — Z7985 Long-term (current) use of injectable non-insulin antidiabetic drugs: Secondary | ICD-10-CM | POA: Diagnosis not present

## 2023-04-04 DIAGNOSIS — J189 Pneumonia, unspecified organism: Secondary | ICD-10-CM | POA: Diagnosis present

## 2023-04-04 DIAGNOSIS — F79 Unspecified intellectual disabilities: Secondary | ICD-10-CM | POA: Diagnosis present

## 2023-04-04 DIAGNOSIS — Z888 Allergy status to other drugs, medicaments and biological substances status: Secondary | ICD-10-CM | POA: Diagnosis not present

## 2023-04-04 DIAGNOSIS — Z1152 Encounter for screening for COVID-19: Secondary | ICD-10-CM | POA: Diagnosis not present

## 2023-04-04 DIAGNOSIS — N179 Acute kidney failure, unspecified: Secondary | ICD-10-CM | POA: Diagnosis present

## 2023-04-04 DIAGNOSIS — R6510 Systemic inflammatory response syndrome (SIRS) of non-infectious origin without acute organ dysfunction: Secondary | ICD-10-CM | POA: Diagnosis present

## 2023-04-04 LAB — GLUCOSE, CAPILLARY
Glucose-Capillary: 214 mg/dL — ABNORMAL HIGH (ref 70–99)
Glucose-Capillary: 151 mg/dL — ABNORMAL HIGH (ref 70–99)
Glucose-Capillary: 156 mg/dL — ABNORMAL HIGH (ref 70–99)
Glucose-Capillary: 143 mg/dL — ABNORMAL HIGH (ref 70–99)
Glucose-Capillary: 138 mg/dL — ABNORMAL HIGH (ref 70–99)
Glucose-Capillary: 141 mg/dL — ABNORMAL HIGH (ref 70–99)
Glucose-Capillary: 175 mg/dL — ABNORMAL HIGH (ref 70–99)
Glucose-Capillary: 158 mg/dL — ABNORMAL HIGH (ref 70–99)

## 2023-04-04 LAB — CBC WITH DIFFERENTIAL/PLATELET
Abs Immature Granulocytes: 0.56 10*3/uL — ABNORMAL HIGH (ref 0.00–0.07)
Basophils Absolute: 0.2 10*3/uL — ABNORMAL HIGH (ref 0.0–0.1)
Basophils Relative: 0 %
Eosinophils Absolute: 0 10*3/uL (ref 0.0–0.5)
Eosinophils Relative: 0 %
HCT: 53.7 % — ABNORMAL HIGH (ref 36.0–46.0)
Hemoglobin: 17.6 g/dL — ABNORMAL HIGH (ref 12.0–15.0)
Immature Granulocytes: 2 %
Lymphocytes Relative: 5 %
Lymphs Abs: 1.9 10*3/uL (ref 0.7–4.0)
MCH: 28.3 pg (ref 26.0–34.0)
MCHC: 32.8 g/dL (ref 30.0–36.0)
MCV: 86.2 fL (ref 80.0–100.0)
Monocytes Absolute: 2.9 10*3/uL — ABNORMAL HIGH (ref 0.1–1.0)
Monocytes Relative: 9 %
Neutro Abs: 28.8 10*3/uL — ABNORMAL HIGH (ref 1.7–7.7)
Neutrophils Relative %: 84 %
Platelets: 442 10*3/uL — ABNORMAL HIGH (ref 150–400)
RBC: 6.23 MIL/uL — ABNORMAL HIGH (ref 3.87–5.11)
RDW: 14.4 % (ref 11.5–15.5)
Smear Review: NORMAL
WBC: 34.4 10*3/uL — ABNORMAL HIGH (ref 4.0–10.5)
nRBC: 0 % (ref 0.0–0.2)

## 2023-04-04 LAB — I-STAT VENOUS BLOOD GAS, ED
Acid-base deficit: 23 mmol/L — ABNORMAL HIGH (ref 0.0–2.0)
Bicarbonate: 4.5 mmol/L — ABNORMAL LOW (ref 20.0–28.0)
Calcium, Ion: 1.18 mmol/L (ref 1.15–1.40)
HCT: 57 % — ABNORMAL HIGH (ref 36.0–46.0)
Hemoglobin: 19.4 g/dL — ABNORMAL HIGH (ref 12.0–15.0)
O2 Saturation: 78 %
Patient temperature: 37
Potassium: 3.9 mmol/L (ref 3.5–5.1)
Sodium: 135 mmol/L (ref 135–145)
TCO2: <5 mmol/L — ABNORMAL LOW (ref 22–32)
pCO2, Ven: 15.5 mm[Hg] — CL (ref 44–60)
pH, Ven: 7.074 — CL (ref 7.25–7.43)
pO2, Ven: 57 mm[Hg] — ABNORMAL HIGH (ref 32–45)

## 2023-04-04 LAB — BASIC METABOLIC PANEL
Anion gap: 16 — ABNORMAL HIGH (ref 5–15)
Anion gap: 20 — ABNORMAL HIGH (ref 5–15)
Anion gap: 16 — ABNORMAL HIGH (ref 5–15)
BUN: 21 mg/dL — ABNORMAL HIGH (ref 6–20)
BUN: 25 mg/dL — ABNORMAL HIGH (ref 6–20)
BUN: 14 mg/dL (ref 6–20)
CO2: 8 mmol/L — ABNORMAL LOW (ref 22–32)
CO2: 8 mmol/L — ABNORMAL LOW (ref 22–32)
CO2: 8 mmol/L — ABNORMAL LOW (ref 22–32)
Calcium: 8.6 mg/dL — ABNORMAL LOW (ref 8.9–10.3)
Calcium: 9 mg/dL (ref 8.9–10.3)
Calcium: 8.9 mg/dL (ref 8.9–10.3)
Chloride: 109 mmol/L (ref 98–111)
Chloride: 110 mmol/L (ref 98–111)
Chloride: 113 mmol/L — ABNORMAL HIGH (ref 98–111)
Creatinine, Ser: 0.94 mg/dL (ref 0.44–1.00)
Creatinine, Ser: 1.23 mg/dL — ABNORMAL HIGH (ref 0.44–1.00)
Creatinine, Ser: 0.95 mg/dL (ref 0.44–1.00)
GFR, Estimated: 55 mL/min — ABNORMAL LOW (ref >60)
GFR, Estimated: >60 mL/min (ref >60)
GFR, Estimated: >60 mL/min (ref >60)
Glucose, Bld: 164 mg/dL — ABNORMAL HIGH (ref 70–99)
Glucose, Bld: 192 mg/dL — ABNORMAL HIGH (ref 70–99)
Glucose, Bld: 162 mg/dL — ABNORMAL HIGH (ref 70–99)
Potassium: 2.9 mmol/L — ABNORMAL LOW (ref 3.5–5.1)
Potassium: 3.5 mmol/L (ref 3.5–5.1)
Potassium: 3.7 mmol/L (ref 3.5–5.1)
Sodium: 134 mmol/L — ABNORMAL LOW (ref 135–145)
Sodium: 137 mmol/L (ref 135–145)
Sodium: 137 mmol/L (ref 135–145)

## 2023-04-04 LAB — URINALYSIS, W/ REFLEX TO CULTURE (INFECTION SUSPECTED)
Glucose, UA: >=500 mg/dL — AB
Ketones, ur: >=80 mg/dL — AB
Leukocytes,Ua: NEGATIVE
Nitrite: NEGATIVE
Protein, ur: >=300 mg/dL — AB
Specific Gravity, Urine: 1.025 (ref 1.005–1.030)
pH: 5.5 (ref 5.0–8.0)

## 2023-04-04 LAB — RESP PANEL BY RT-PCR (RSV, FLU A&B, COVID)  RVPGX2
Influenza A by PCR: NEGATIVE
Influenza B by PCR: NEGATIVE
Resp Syncytial Virus by PCR: NEGATIVE
SARS Coronavirus 2 by RT PCR: NEGATIVE

## 2023-04-04 LAB — BLOOD GAS, ARTERIAL
Acid-base deficit: 15.1 mmol/L — ABNORMAL HIGH (ref 0.0–2.0)
Bicarbonate: 8.7 mmol/L — ABNORMAL LOW (ref 20.0–28.0)
Drawn by: 270211
O2 Saturation: 97 %
Patient temperature: 37.2
pCO2 arterial: 18 mm[Hg] — CL (ref 32–48)
pH, Arterial: 7.29 — ABNORMAL LOW (ref 7.35–7.45)
pO2, Arterial: 76 mm[Hg] — ABNORMAL LOW (ref 83–108)

## 2023-04-04 LAB — COMPREHENSIVE METABOLIC PANEL
ALT: 29 U/L (ref 0–44)
AST: 23 U/L (ref 15–41)
Albumin: 5.2 g/dL — ABNORMAL HIGH (ref 3.5–5.0)
Alkaline Phosphatase: 94 U/L (ref 38–126)
BUN: 33 mg/dL — ABNORMAL HIGH (ref 6–20)
CO2: <7 mmol/L — ABNORMAL LOW (ref 22–32)
Calcium: 9.5 mg/dL (ref 8.9–10.3)
Chloride: 101 mmol/L (ref 98–111)
Creatinine, Ser: 1.65 mg/dL — ABNORMAL HIGH (ref 0.44–1.00)
GFR, Estimated: 39 mL/min — ABNORMAL LOW (ref >60)
Glucose, Bld: 257 mg/dL — ABNORMAL HIGH (ref 70–99)
Potassium: 3.9 mmol/L (ref 3.5–5.1)
Sodium: 135 mmol/L (ref 135–145)
Total Bilirubin: 2 mg/dL — ABNORMAL HIGH (ref <1.2)
Total Protein: 9.2 g/dL — ABNORMAL HIGH (ref 6.5–8.1)

## 2023-04-04 LAB — LACTIC ACID, PLASMA
Lactic Acid, Venous: 1.2 mmol/L (ref 0.5–1.9)
Lactic Acid, Venous: 1.6 mmol/L (ref 0.5–1.9)

## 2023-04-04 LAB — CBG MONITORING, ED
Glucose-Capillary: 269 mg/dL — ABNORMAL HIGH (ref 70–99)
Glucose-Capillary: 188 mg/dL — ABNORMAL HIGH (ref 70–99)

## 2023-04-04 LAB — PREGNANCY, URINE: Preg Test, Ur: NEGATIVE

## 2023-04-04 LAB — PROTIME-INR
INR: 1.2 (ref 0.8–1.2)
Prothrombin Time: 15.3 s — ABNORMAL HIGH (ref 11.4–15.2)

## 2023-04-04 LAB — BETA-HYDROXYBUTYRIC ACID
Beta-Hydroxybutyric Acid: >8 mmol/L — ABNORMAL HIGH (ref 0.05–0.27)
Beta-Hydroxybutyric Acid: 4.35 mmol/L — ABNORMAL HIGH (ref 0.05–0.27)
Beta-Hydroxybutyric Acid: 5.34 mmol/L — ABNORMAL HIGH (ref 0.05–0.27)

## 2023-04-04 LAB — MRSA NEXT GEN BY PCR, NASAL: MRSA by PCR Next Gen: NOT DETECTED

## 2023-04-04 LAB — APTT: aPTT: 29 s (ref 24–36)

## 2023-04-04 LAB — CK: Total CK: 362 U/L — ABNORMAL HIGH (ref 38–234)

## 2023-04-04 MED ORDER — PIPERACILLIN-TAZOBACTAM 3.375 G IVPB
3.3750 g | Freq: Three times a day (TID) | INTRAVENOUS | Status: AC
  Administered 2023-04-04 – 2023-04-09 (×15): 3.375 g via INTRAVENOUS
  Filled 2023-04-04 (×14): qty 50

## 2023-04-04 MED ORDER — DEXTROSE IN LACTATED RINGERS 5 % IV SOLN: INTRAVENOUS | Status: AC

## 2023-04-04 MED ORDER — DEXTROSE IN LACTATED RINGERS 5 % IV SOLN: INTRAVENOUS | Status: DC

## 2023-04-04 MED ORDER — METRONIDAZOLE 500 MG/100ML IV SOLN
500.0000 mg | Freq: Once | INTRAVENOUS | Status: AC
  Administered 2023-04-04: 500 mg via INTRAVENOUS
  Filled 2023-04-04: qty 100

## 2023-04-04 MED ORDER — LACTATED RINGERS IV BOLUS (SEPSIS)
1000.0000 mL | Freq: Once | INTRAVENOUS | Status: AC
  Administered 2023-04-04: 1000 mL via INTRAVENOUS

## 2023-04-04 MED ORDER — INSULIN REGULAR(HUMAN) IN NACL 100-0.9 UT/100ML-% IV SOLN
INTRAVENOUS | Status: DC
  Administered 2023-04-04: 5 [IU]/h via INTRAVENOUS
  Administered 2023-04-05: 1.7 [IU]/h via INTRAVENOUS
  Filled 2023-04-04 (×3): qty 100

## 2023-04-04 MED ORDER — VANCOMYCIN HCL IN DEXTROSE 1-5 GM/200ML-% IV SOLN
1000.0000 mg | Freq: Once | INTRAVENOUS | Status: AC
  Administered 2023-04-04: 1000 mg via INTRAVENOUS
  Filled 2023-04-04: qty 200

## 2023-04-04 MED ORDER — ONDANSETRON HCL 4 MG/2ML IJ SOLN
4.0000 mg | Freq: Once | INTRAMUSCULAR | Status: AC
  Administered 2023-04-04: 4 mg via INTRAVENOUS
  Filled 2023-04-04: qty 2

## 2023-04-04 MED ORDER — DOXYCYCLINE HYCLATE 100 MG IV SOLR
100.0000 mg | Freq: Two times a day (BID) | INTRAVENOUS | Status: DC
  Administered 2023-04-04 – 2023-04-09 (×10): 100 mg via INTRAVENOUS
  Filled 2023-04-04 (×10): qty 100

## 2023-04-04 MED ORDER — POTASSIUM CHLORIDE 10 MEQ/100ML IV SOLN
10.0000 meq | INTRAVENOUS | Status: AC
  Administered 2023-04-04 – 2023-04-05 (×6): 10 meq via INTRAVENOUS
  Filled 2023-04-04 (×4): qty 100

## 2023-04-04 MED ORDER — LACTATED RINGERS IV SOLN: INTRAVENOUS | Status: DC

## 2023-04-04 MED ORDER — SODIUM CHLORIDE 0.9 % IV SOLN: INTRAVENOUS | Status: AC | PRN

## 2023-04-04 MED ORDER — VANCOMYCIN HCL 750 MG/150ML IV SOLN
750.0000 mg | Freq: Once | INTRAVENOUS | Status: DC
  Filled 2023-04-04 (×2): qty 150

## 2023-04-04 MED ORDER — HYDRALAZINE HCL 20 MG/ML IJ SOLN: 10.0000 mg | Freq: Four times a day (QID) | INTRAMUSCULAR | Status: DC | PRN

## 2023-04-04 MED ORDER — IOHEXOL 300 MG/ML  SOLN
75.0000 mL | Freq: Once | INTRAMUSCULAR | Status: AC | PRN
  Administered 2023-04-04: 75 mL via INTRAVENOUS

## 2023-04-04 MED ORDER — POTASSIUM CHLORIDE 10 MEQ/100ML IV SOLN
10.0000 meq | INTRAVENOUS | Status: DC
  Filled 2023-04-04 (×2): qty 100

## 2023-04-04 MED ORDER — VANCOMYCIN HCL 1000 MG IV SOLR
750.0000 mg | Freq: Once | INTRAVENOUS | Status: AC
  Administered 2023-04-04: 750 mg via INTRAVENOUS
  Filled 2023-04-04: qty 15

## 2023-04-04 MED ORDER — CHLORHEXIDINE GLUCONATE CLOTH 2 % EX PADS
6.0000 | MEDICATED_PAD | Freq: Every day | CUTANEOUS | Status: DC
  Administered 2023-04-04 – 2023-04-08 (×5): 6 via TOPICAL

## 2023-04-04 MED ORDER — LACTATED RINGERS IV BOLUS (SEPSIS)
2000.0000 mL | Freq: Once | INTRAVENOUS | Status: AC
  Administered 2023-04-04: 2000 mL via INTRAVENOUS

## 2023-04-04 MED ORDER — VANCOMYCIN HCL IN DEXTROSE 1-5 GM/200ML-% IV SOLN: 1000.0000 mg | Freq: Once | INTRAVENOUS | Status: DC

## 2023-04-04 MED ORDER — SODIUM CHLORIDE 0.9 % IV SOLN
2.0000 g | Freq: Once | INTRAVENOUS | Status: AC
  Administered 2023-04-04: 2 g via INTRAVENOUS
  Filled 2023-04-04: qty 12.5

## 2023-04-04 MED ORDER — DEXTROSE 50 % IV SOLN: 0.0000 mL | INTRAVENOUS | Status: DC | PRN

## 2023-04-04 MED ORDER — POTASSIUM CHLORIDE 10 MEQ/100ML IV SOLN
INTRAVENOUS | Status: AC
  Filled 2023-04-04: qty 100

## 2023-04-04 MED ORDER — ENOXAPARIN SODIUM 40 MG/0.4ML IJ SOSY
40.0000 mg | PREFILLED_SYRINGE | INTRAMUSCULAR | Status: DC
  Administered 2023-04-05 – 2023-04-10 (×6): 40 mg via SUBCUTANEOUS
  Filled 2023-04-04 (×6): qty 0.4

## 2023-04-04 NOTE — H&P (Signed)
NAME:  Shelia Lin, MRN:  [id number], DOB:  1976/02/11, LOS: 0 ADMISSION DATE:  04/04/2023, CONSULTATION DATE:  04/04/23 REFERRING MD:  EDP, CHIEF COMPLAINT:  URI   History of Present Illness:  47 year old female with intellectual disability, DM2, HLD, chronic abdominal pain and GERD who presented with flu like symptoms and N/V. Dad reports she has not eaten much in the last four days. She has a caretaker and patient has been taking her DM2 medications as far as he knows. In the [facility] ED, she was found to be altered and acidemic with kussmaul respirations. Protecting airway. Urine with ketones.  Started on fluids and insulin drip.  PCCM to admit to ICU for DKA, sepsis.  Given broad spectrum abx prior to transfer.  CT A/P negative for acute abnormalities. Labs significant for ABG 7.10/19/55. CO2 <7, glucose 257, BHA >8. S/p 5L LR and started on mIVF and insulin gtt. Patient transferred to [facility] for admission.  Pertinent  Medical History  DM2   Significant Hospital Events: Including procedures, antibiotic start and stop dates in addition to other pertinent events   12/29 admit  Interim History / Subjective:  Consult  Objective   Blood pressure (!) 173/100, pulse (!) 127, temperature 98.8 F (37.1 C), temperature source Rectal, resp. rate (!) 30, height 5\' 3"  (1.6 m), weight 88.9 kg, SpO2 100%.        Intake/Output Summary (Last 24 hours) at 04/04/2023 1309 Last data filed at 04/04/2023 1240 Gross per 24 hour  Intake 100 ml  Output 300 ml  Net -200 ml   Filed Weights   04/04/23 1132  Weight: 88.9 kg   Physical Exam: General: Obese, chronically ill-appearing, no acute distress HENT: , AT, OP clear, Dry MM Eyes: EOMI, no scleral icterus Respiratory: Clear to auscultation bilaterally.  No crackles, wheezing or rales Cardiovascular: RRR, -M/R/G, no JVD GI: BS+, soft, nontender Extremities:-Edema,-tenderness Neuro: Awake, follows commands, CNII-XII grossly intact Psych:  Intellectual disability GU: Foley in place  Flu/SARS neg pH 7.07 WBC 33k H/h up likely hemoconcentrated AKI noted CXR read as possible atypical infection but looks pretty normal UA with ketones, glucose, protein, may bacteria but negative nitrite/WBC/RBC?  Resolved Hospital Problem list   N/A  Assessment & Plan:  Acute metabolic encephalopathy- sepsis, DKA - Reorient as needed - Correct underlying problem(s)  Anion gap acidemia with ketones in urine presumed euglycemic DKA - Fluids and insulin per usual protocol and frequent BMP, BHA, glucose checks  Acute respiratory distress due to severe acidemia - Should improve as acidemia improves  Sepsis with viral-type illness preceding, N/V SOB - RVP negative. CT A/P neg for acute issues - Zosyn, doxy should cover most bacterial pathogens (GI, PNA etc) - Check Pct  Acute kidney injury, hypovolemia+ query rhabdo with large blood but neg RBC on urin CK 362 - CT A/P reviewed: has distended bladder, will have foley placed - Aggressive rehydration   Best Practice (right click and "Reselect all SmartList Selections" daily)   Diet/type: NPO DVT prophylaxis LMWH Pressure ulcer(s): N/A GI prophylaxis: N/A Lines: N/A Foley:  Yes, and it is still needed Code Status:  full code Last date of multidisciplinary goals of care discussion [pending]  Labs   CBC: Recent Labs  Lab 04/04/23 1138 04/04/23 1151  WBC 34.4*  --   NEUTROABS 28.8*  --   HGB 17.6* 19.4*  HCT 53.7* 57.0*  MCV 86.2  --   PLT 442*  --     Basic Metabolic  Panel: Recent Labs  Lab 04/04/23 1138 04/04/23 1151  NA 135 135  K 3.9 3.9  CL 101  --   CO2 <7*  --   GLUCOSE 257*  --   BUN 33*  --   CREATININE 1.65*  --   CALCIUM 9.5  --    GFR: Estimated Creatinine Clearance: 45.1 mL/min (A) (by C-G formula based on SCr of 1.65 mg/dL (H)). Recent Labs  Lab 04/04/23 1138  WBC 34.4*  LATICACIDVEN 1.2    Liver Function Tests: Recent Labs  Lab  04/04/23 1138  AST 23  ALT 29  ALKPHOS 94  BILITOT 2.0*  PROT 9.2*  ALBUMIN 5.2*   No results for input(s): "LIPASE", "AMYLASE" in the last 168 hours. No results for input(s): "AMMONIA" in the last 168 hours.  ABG    Component Value Date/Time   HCO3 4.5 (L) 04/04/2023 1151   TCO2 <5 (L) 04/04/2023 1151   ACIDBASEDEF 23.0 (H) 04/04/2023 1151   O2SAT 78 04/04/2023 1151     Coagulation Profile: Recent Labs  Lab 04/04/23 1138  INR 1.2    Cardiac Enzymes: No results for input(s): "CKTOTAL", "CKMB", "CKMBINDEX", "TROPONINI" in the last 168 hours.  HbA1C: No results found for: "HGBA1C"  CBG: Recent Labs  Lab 04/04/23 1136  GLUCAP 269*    Review of Systems:   Unable to obtain due to intellectual disability  Past Medical History:  She,  has a past medical history of Diabetes mellitus without complication (HCC), Hypercholesteremia, Mentally disabled, Mood disorder (HCC), and PMS (premenstrual syndrome).   Surgical History:  History reviewed. No pertinent surgical history.   Social History:   reports that she has never smoked. She has never used smokeless tobacco. She reports that she does not drink alcohol and does not use drugs.   Family History:  Her family history is not on file.   Allergies Allergies  Allergen Reactions   Erythromycin Rash     Home Medications  Prior to Admission medications   Medication Sig Start Date End Date Taking? Authorizing Provider  clindamycin (CLEOCIN) 150 MG capsule Take 3 capsules (450 mg total) by mouth 3 (three) times daily. 10/25/17   Petrucelli, Samantha R, PA-C  dapagliflozin propanediol (FARXIGA) 10 MG TABS tablet Take 10 mg by mouth daily.    [provider]  dicyclomine (BENTYL) 10 MG capsule Take 10 mg by mouth 4 (four) times daily -  before meals and at bedtime.    [provider]  glucose blood test strip 1 each by Other route as needed for other. Use as instructed    [provider]   liraglutide (VICTOZA) 18 MG/3ML SOPN Inject into the skin.    [provider]  metFORMIN (GLUCOPHAGE) 1000 MG tablet Take 1,000 mg by mouth 2 (two) times daily with a meal.    [provider]  OXcarbazepine (TRILEPTAL) 150 MG tablet Take 150 mg by mouth 2 (two) times daily.    [provider]  pioglitazone (ACTOS) 15 MG tablet Take 15 mg by mouth daily.    [provider]  rosuvastatin (CRESTOR) 10 MG tablet Take 10 mg by mouth daily.    [provider]  sertraline (ZOLOFT) 100 MG tablet Take 100 mg by mouth daily.    [provider]     Critical care time: 50 min    The patient is critically ill with multiple organ systems failure and requires high complexity decision making for assessment and support, frequent evaluation  and titration of therapies, application of advanced monitoring technologies and extensive interpretation of multiple databases.  Independent Critical Care Time: 50 Minutes.   Mechele Collin, M.D. [facility] Pulmonary/Critical Care Medicine 04/04/2023 3:18 PM   Please see Amion for pager number to reach on-call Pulmonary and Critical Care Team.

## 2023-04-04 NOTE — ED Triage Notes (Addendum)
Pt with SHOB and lethargy since yesterday per father; cold symptoms and vomiting since Thurs, worse since last night; pt noted to be tachypneic and tachycardic in triage

## 2023-04-04 NOTE — ED Notes (Signed)
LR DC due to rales in lower lobes. Provider notified.

## 2023-04-04 NOTE — ED Notes (Signed)
Report given to [facility]. 

## 2023-04-04 NOTE — Sepsis Progress Note (Signed)
Sepsis protocol is being followed by [facility]. 

## 2023-04-04 NOTE — Procedures (Signed)
Arterial Line Insertion Start/End12/29/2024 4:17 PM  Patient location: ICU. Preanesthetic checklist: patient identified, IV checked, site marked, risks and benefits discussed, surgical consent, monitors and equipment checked and timeout performed radial was placed Catheter size: 18 G Hand hygiene performed  and Seldinger technique used Allen's test indicative of satisfactory collateral circulation Attempts: 1 Procedure performed without using ultrasound guided technique. Following insertion, line sutured. Patient tolerated the procedure well with no immediate complications.

## 2023-04-04 NOTE — Progress Notes (Signed)
Pharmacy Antibiotic Note  Shelia Lin is a 47 y.o. female admitted on 04/04/2023 with pneumonia.  Pharmacy has been consulted for zosyn dosing.  Plan: Zosyn 3.375g IV q8h  F/u renal func, LOT, cultures   Height: 5\' 3"  (160 cm) Weight: 88.9 kg (195 lb 15.8 oz) IBW/kg (Calculated) : 52.4  Temp (24hrs), Avg:98.2 F (36.8 C), Min:97.1 F (36.2 C), Max:98.8 F (37.1 C)  Recent Labs  Lab 04/04/23 1138  WBC 34.4*  CREATININE 1.65*  LATICACIDVEN 1.2    Estimated Creatinine Clearance: 45.1 mL/min (A) (by C-G formula based on SCr of 1.65 mg/dL (H)).    Allergies  Allergen Reactions   Erythromycin Rash    Antimicrobials this admission: Vanc 12/29 x1 (1750mg  load) Cefepime 12/29 x1 Flagyl 12/29 x1 Zosyn 12/29>   Dose adjustments this admission:   Microbiology results: 12/29 RVP neg 12/29 Grier Rocher, PharmD, BCCCP Clinical Pharmacist 04/04/2023 1:44 PM

## 2023-04-04 NOTE — Progress Notes (Signed)
[facility] Physician-Brief Progress Note Patient Name: Shelia Lin DOB: January 24, 1976 MRN: [id number]   Date of Service  04/04/2023  HPI/Events of Note    eICU Interventions      HD stable No mech ventilation Sats 97-98% flat line A line tracing Will dc Pt has good perpheral IVs     Jahna Liebert 04/04/2023, 10:05 PM

## 2023-04-04 NOTE — ED Notes (Signed)
Called [facility] for transport 

## 2023-04-04 NOTE — ED Provider Notes (Signed)
[facility] Provider Note   CSN: [id number] Arrival date & time: 04/04/23  1118     History  Chief Complaint  Patient presents with   Shortness of Breath   Lethargic    Shelia Lin is a 47 y.o. female.   Shortness of Breath   47 year old female presents emergency department accompanied by father.  Father main historian given patient's somewhat altered mentation.  Father states the patient has been ill with symptoms since Thursday.  Was complaining of generalized abdominal pain as well as nausea and vomiting.  States that symptoms persisted through the weekend with noticing more confusion on Saturday.  Father also states patient with increased work of breathing.  Denies any known fever, chest pain, hematemesis, urinary symptoms, change in bowel habits.  Patient has been intolerant of p.o. since symptoms again on Thursday.  Past medical history significant for diabetes mellitus type 2 on Farxiga, hypercholesterolemia, PMS, Mentally disabled.   Home Medications Prior to Admission medications   Medication Sig Start Date End Date Taking? Authorizing Provider  clindamycin (CLEOCIN) 150 MG capsule Take 3 capsules (450 mg total) by mouth 3 (three) times daily. 10/25/17   Petrucelli, Samantha R, PA-C  dapagliflozin propanediol (FARXIGA) 10 MG TABS tablet Take 10 mg by mouth daily.    [provider]  dicyclomine (BENTYL) 10 MG capsule Take 10 mg by mouth 4 (four) times daily -  before meals and at bedtime.    [provider]  glucose blood test strip 1 each by Other route as needed for other. Use as instructed    [provider]  liraglutide (VICTOZA) 18 MG/3ML SOPN Inject into the skin.    [provider]  metFORMIN (GLUCOPHAGE) 1000 MG tablet Take 1,000 mg by mouth 2 (two) times daily with a meal.    [provider]  OXcarbazepine (TRILEPTAL) 150 MG tablet Take 150 mg by mouth 2 (two) times daily.     [provider]  pioglitazone (ACTOS) 15 MG tablet Take 15 mg by mouth daily.    [provider]  rosuvastatin (CRESTOR) 10 MG tablet Take 10 mg by mouth daily.    [provider]  sertraline (ZOLOFT) 100 MG tablet Take 100 mg by mouth daily.    [provider]      Allergies    Erythromycin    Review of Systems   Review of Systems  Respiratory:  Positive for shortness of breath.   All other systems reviewed and are negative.   Physical Exam Updated Vital Signs BP (!) 173/100   Pulse (!) 127   Temp 98.8 F (37.1 C) (Rectal)   Resp (!) 30   Ht 5\' 3"  (1.6 m)   Wt 88.9 kg   SpO2 100%   BMI 34.72 kg/m  Physical Exam Vitals and nursing note reviewed.  Constitutional:      General: She is in acute distress.     Appearance: She is well-developed. She is ill-appearing.     Comments: Patient unresponsive to command but when sternal rub helped, will respond with name.  Still unable to give detailed history.  HENT:     Head: Normocephalic and atraumatic.  Eyes:     Conjunctiva/sclera: Conjunctivae normal.  Cardiovascular:     Rate and Rhythm: Regular rhythm. Tachycardia present.     Heart sounds: No murmur heard. Pulmonary:     Effort: Pulmonary effort is normal. Tachypnea present. No respiratory distress.  Breath sounds: Normal breath sounds. No wheezing, rhonchi or rales.  Abdominal:     General: There is no distension.     Palpations: Abdomen is soft.     Tenderness: There is no abdominal tenderness. There is no guarding.  Musculoskeletal:        General: No swelling.     Cervical back: Neck supple.     Right lower leg: No edema.     Left lower leg: No edema.  Skin:    General: Skin is warm and dry.     Capillary Refill: Capillary refill takes less than 2 seconds.  Psychiatric:        Mood and Affect: Mood normal.     ED Results / Procedures / Treatments   Labs (all labs ordered are listed, but only abnormal results are  displayed) Labs Reviewed  COMPREHENSIVE METABOLIC PANEL - Abnormal; Notable for the following components:      Result Value   CO2 <7 (*)    Glucose, Bld 257 (*)    BUN 33 (*)    Creatinine, Ser 1.65 (*)    Total Protein 9.2 (*)    Albumin 5.2 (*)    Total Bilirubin 2.0 (*)    GFR, Estimated 39 (*)    All other components within normal limits  CBC WITH DIFFERENTIAL/PLATELET - Abnormal; Notable for the following components:   WBC 34.4 (*)    RBC 6.23 (*)    Hemoglobin 17.6 (*)    HCT 53.7 (*)    Platelets 442 (*)    Neutro Abs 28.8 (*)    Monocytes Absolute 2.9 (*)    Basophils Absolute 0.2 (*)    Abs Immature Granulocytes 0.56 (*)    All other components within normal limits  PROTIME-INR - Abnormal; Notable for the following components:   Prothrombin Time 15.3 (*)    All other components within normal limits  URINALYSIS, W/ REFLEX TO CULTURE (INFECTION SUSPECTED) - Abnormal; Notable for the following components:   APPearance CLOUDY (*)    Glucose, UA >=500 (*)    Hgb urine dipstick LARGE (*)    Bilirubin Urine LARGE (*)    Ketones, ur >=80 (*)    Protein, ur >=300 (*)    Bacteria, UA MANY (*)    All other components within normal limits  I-STAT VENOUS BLOOD GAS, ED - Abnormal; Notable for the following components:   pH, Ven 7.074 (*)    pCO2, Ven 15.5 (*)    pO2, Ven 57 (*)    Bicarbonate 4.5 (*)    TCO2 <5 (*)    Acid-base deficit 23.0 (*)    HCT 57.0 (*)    Hemoglobin 19.4 (*)    All other components within normal limits  CBG MONITORING, ED - Abnormal; Notable for the following components:   Glucose-Capillary 269 (*)    All other components within normal limits  CBG MONITORING, ED - Abnormal; Notable for the following components:   Glucose-Capillary 188 (*)    All other components within normal limits  RESP PANEL BY RT-PCR (RSV, FLU A&B, COVID)  RVPGX2  CULTURE, BLOOD (ROUTINE X 2)  CULTURE, BLOOD (ROUTINE X 2)  RESPIRATORY PANEL BY PCR  LACTIC ACID, PLASMA   APTT  PREGNANCY, URINE  LACTIC ACID, PLASMA  BETA-HYDROXYBUTYRIC ACID  CK  PROCALCITONIN  BASIC METABOLIC PANEL  BASIC METABOLIC PANEL  BASIC METABOLIC PANEL  BASIC METABOLIC PANEL  BETA-HYDROXYBUTYRIC ACID  BETA-HYDROXYBUTYRIC ACID  HEMOGLOBIN A1C    EKG  EKG Interpretation Date/Time:  Sunday April 04 2023 11:38:11 EST Ventricular Rate:  149 PR Interval:  117 QRS Duration:  83 QT Interval:  292 QTC Calculation: 446 R Axis:   74  Text Interpretation: Sinus tachycardia Paired ventricular premature complexes Aberrant conduction of SV complex(es) Borderline T wave abnormalities Minimal ST elevation, inferior leads Confirmed by Estanislado Pandy [phone]) on 04/04/2023 11:45:46 AM  Radiology CT ABDOMEN PELVIS W CONTRAST Result Date: 04/04/2023 CLINICAL DATA:  Shortness of breath and lethargy since yesterday. Vomiting since Thursday. Tachypneic and tachycardic. Abdominal pain. EXAM: CT ABDOMEN AND PELVIS WITH CONTRAST TECHNIQUE: Multidetector CT imaging of the abdomen and pelvis was performed using the standard protocol following bolus administration of intravenous contrast. RADIATION DOSE REDUCTION: This exam was performed according to the departmental dose-optimization program which includes automated exposure control, adjustment of the mA and/or kV according to patient size and/or use of iterative reconstruction technique. CONTRAST:  75mL OMNIPAQUE IOHEXOL 300 MG/ML  SOLN COMPARISON:  CT abdomen and pelvis 06/30/2022 FINDINGS: Lower chest: No acute abnormality. Hepatobiliary: Hepatic steatosis. Unremarkable gallbladder and biliary tree. Pancreas: Unremarkable. Spleen: Unchanged hypoattenuating lesion in the anterior spleen, likely a cyst or hemangioma. Adrenals/Urinary Tract: Stable adrenal glands. No urinary calculi or hydronephrosis. Unremarkable bladder. Stomach/Bowel: Normal caliber large and small bowel. No bowel wall thickening. Stomach and appendix are within normal limits.  Vascular/Lymphatic: No significant vascular findings are present. No enlarged abdominal or pelvic lymph nodes. Reproductive: Status post hysterectomy. No adnexal masses. Other: No free intraperitoneal fluid or air. Musculoskeletal: No acute fracture. IMPRESSION: 1. No acute abdominopelvic abnormality. 2. Hepatic steatosis. Electronically Signed   By: Minerva Fester M.D.   On: 04/04/2023 13:34   DG Chest Port 1 View Result Date: 04/04/2023 CLINICAL DATA:  Questionable sepsis - evaluate for abnormality EXAM: PORTABLE CHEST - 1 VIEW COMPARISON:  01/10/2018. FINDINGS: The heart size and mediastinal contours are within normal limits. There is diffuse pulmonary interstitial prominence which could be seen with atypical infection, edema or interstitial lung disease. There is no focal consolidation. No pneumothorax or pleural effusion. The visualized osseous structures are unremarkable. IMPRESSION: Nonspecific interstitial prominence consistent with atypical infection, edema or interstitial lung disease. No focal consolidation. Electronically Signed   By: Layla Maw M.D.   On: 04/04/2023 12:36    Procedures .Critical Care  Performed by: Peter Garter, PA Authorized by: Peter Garter, PA   Critical care provider statement:    Critical care time (minutes):  56   Critical care was necessary to treat or prevent imminent or life-threatening deterioration of the following conditions:  Sepsis and endocrine crisis   Critical care was time spent personally by me on the following activities:  Development of treatment plan with patient or surrogate, discussions with consultants, evaluation of patient's response to treatment, examination of patient, ordering and review of laboratory studies, ordering and review of radiographic studies, ordering and performing treatments and interventions, pulse oximetry, re-evaluation of patient's condition and review of old charts   I assumed direction of critical care  for this patient from another provider in my specialty: no     Care discussed with: admitting provider       Medications Ordered in ED Medications  vancomycin (VANCOCIN) IVPB 1000 mg/200 mL premix (has no administration in time range)    Followed by  vancomycin (VANCOREADY) IVPB 750 mg/150 mL (has no administration in time range)  insulin regular, human (MYXREDLIN) 100 units/ 100 mL infusion (has no administration in time range)  dextrose  5 % in lactated ringers infusion (has no administration in time range)  dextrose 50 % solution 0-50 mL (has no administration in time range)  potassium chloride 10 mEq in 100 mL IVPB (has no administration in time range)  lactated ringers bolus 2,000 mL (2,000 mLs Intravenous New Bag/Given 04/04/23 1323)  doxycycline (VIBRAMYCIN) 100 mg in dextrose 5 % 250 mL IVPB (has no administration in time range)  lactated ringers infusion (has no administration in time range)  dextrose 5 % in lactated ringers infusion (has no administration in time range)  piperacillin-tazobactam (ZOSYN) IVPB 3.375 g (has no administration in time range)  lactated ringers bolus 1,000 mL (0 mLs Intravenous Stopped 04/04/23 1322)  ondansetron (ZOFRAN) injection 4 mg (4 mg Intravenous Given 04/04/23 1152)  ceFEPIme (MAXIPIME) 2 g in sodium chloride 0.9 % 100 mL IVPB (0 g Intravenous Stopped 04/04/23 1240)  metroNIDAZOLE (FLAGYL) IVPB 500 mg (500 mg Intravenous New Bag/Given 04/04/23 1240)  lactated ringers bolus 1,000 mL (0 mLs Intravenous Stopped 04/04/23 1322)  iohexol (OMNIPAQUE) 300 MG/ML solution 75 mL (75 mLs Intravenous Contrast Given 04/04/23 1300)    ED Course/ Medical Decision Making/ A&P Clinical Course as of 04/04/23 1343  Sun Apr 04, 2023  1307 Consulted intensivist Dr. Katrinka Blazing who agreed with admission to [facility]. [CR]    Clinical Course User Index [CR] Peter Garter, PA                                 Medical Decision Making Amount and/or Complexity of Data  Reviewed Labs: ordered. Radiology: ordered.  Risk Prescription drug management.   This patient presents to the ED for concern of shortness of breath, abdominal pain, nausea, vomiting, confusion, this involves an extensive number of treatment options, and is a complaint that carries with it a high risk of complications and morbidity.  The differential diagnosis includes sepsis, DKA, HHS, appendicitis, diverticulitis, viral gastroenteritis, metabolic encephalopathy, other   Co morbidities that complicate the patient evaluation  See HPI   Additional history obtained:  Additional history obtained from EMR External records from outside source obtained and reviewed including hospital records   Lab Tests:  I Ordered, and personally interpreted labs.  The pertinent results include: Bicarb of less than 7.  AKI with creatinine 1.65, BUN of 33 and GFR of 39.  No transaminitis.  Significant leukocytosis of 34.4 with left shift of 28.  Polycythemia with hemoglobin 17.6.  Thrombocytosis of 442.  UA with ketones greater than 80, many bacteria but negative WBCs, nitrite, leukocytes.  VBG with pH of 7.07, pCO2 50.5, pO2 of 57.  CBG of 269   Imaging Studies ordered:  I ordered imaging studies including chest x-ray, CT abdomen pelvis I independently visualized and interpreted imaging which showed  Chest x-ray: Nonspecific interstitial prominence consistent with atypical infection, edema or interstitial lung disease. CT abdomen pelvis: No acute intra-abdominal process.  Hepatic steatosis I agree with the radiologist interpretation  Cardiac Monitoring: / EKG:  The patient was maintained on a cardiac monitor.  I personally viewed and interpreted the cardiac monitored which showed an underlying rhythm of: Sinus tachycardia with borderline T wave changes.  ST elevation inferior leads.   Consultations Obtained:  See ED course  Problem List / ED Course / Critical interventions / Medication  management  Altered mentation, DKA, SIRS I ordered medication including lactated Ringer's, cefepime, Flagyl, vancomycin, D5, insulin  Reevaluation of the patient after  these medicines showed that the patient improved I have reviewed the patients home medicines and have made adjustments as needed   Social Determinants of Health:  Denies Bacot, illicit drug use.   Test / Admission - Considered:  Altered mentation, DKA, SIRS Vitals signs significant for hypertension, tachycardia, tachypnea. Otherwise within normal range and stable throughout visit. Laboratory/imaging studies significant for: See above 47 year old female presents emergency department accompanied by father with complaints of increasing confusion, increased labored breathing, nausea, vomiting abdominal pain.  Symptoms since Thursday progressive worsening since onset.  On exam initially, patient responsive to name after sternal rub but unable to give detailed history.  History mainly given by father at bedside.  Labs concerning initially for leukocytosis of 34.4 with left shift.  In the setting of patient's abdominal pain, nausea and emesis with tachycardia and tachypnea, SIRS protocol was initially performed with broad-spectrum antibiotics, IV fluids and labs obtained.  Labs otherwise concerning for DKA with greater than 80 ketones in urine, pH of 7.074, bicarb of less than 7.  Begun on insulin as well as D5 per DKA protocol drip.  Suspect this is attributing most of the patient's altered mentation.  CT imaging of patient's abdomen/pelvis negative for any acute abnormality.  Chest x-ray was obtained given patient's increased labored breathing as well as describes shortness of breath which showed evidence of possible pneumonia.  Consulted intensivist regarding the patient given complicated nature of patient who agreed with admission and assume further treatment/care. Treatment plan were discussed at length with patient's father and he  knowledge understanding was agreeable to said plan.  Appropriate consultations were made as described in the ED course.           Final Clinical Impression(s) / ED Diagnoses Final diagnoses:  Altered mental status, unspecified altered mental status type  Diabetic ketoacidosis without coma associated with type 2 diabetes mellitus (HCC)  SIRS (systemic inflammatory response syndrome) [facility])    Rx / DC Orders ED Discharge Orders     None         Peter Garter, [place] 04/04/23 1343    Coral Spikes, DO 04/04/23 1530

## 2023-04-05 DIAGNOSIS — E1110 Type 2 diabetes mellitus with ketoacidosis without coma: Secondary | ICD-10-CM | POA: Diagnosis not present

## 2023-04-05 LAB — RESPIRATORY PANEL BY PCR

## 2023-04-05 LAB — URINALYSIS, W/ REFLEX TO CULTURE (INFECTION SUSPECTED)
Bacteria, UA: NONE SEEN
Bilirubin Urine: NEGATIVE
Glucose, UA: >=500 mg/dL — AB
Hgb urine dipstick: NEGATIVE
Ketones, ur: 20 mg/dL — AB
Leukocytes,Ua: NEGATIVE
Nitrite: NEGATIVE
Protein, ur: 100 mg/dL — AB
Specific Gravity, Urine: 1.028 (ref 1.005–1.030)
pH: 5 (ref 5.0–8.0)

## 2023-04-05 LAB — BASIC METABOLIC PANEL
Anion gap: 11 (ref 5–15)
Anion gap: 9 (ref 5–15)
Anion gap: 11 (ref 5–15)
BUN: 12 mg/dL (ref 6–20)
BUN: 9 mg/dL (ref 6–20)
BUN: 6 mg/dL (ref 6–20)
CO2: 10 mmol/L — ABNORMAL LOW (ref 22–32)
CO2: 15 mmol/L — ABNORMAL LOW (ref 22–32)
CO2: 16 mmol/L — ABNORMAL LOW (ref 22–32)
Calcium: 9 mg/dL (ref 8.9–10.3)
Calcium: 9.1 mg/dL (ref 8.9–10.3)
Calcium: 9 mg/dL (ref 8.9–10.3)
Chloride: 116 mmol/L — ABNORMAL HIGH (ref 98–111)
Chloride: 112 mmol/L — ABNORMAL HIGH (ref 98–111)
Chloride: 111 mmol/L (ref 98–111)
Creatinine, Ser: 0.7 mg/dL (ref 0.44–1.00)
Creatinine, Ser: 0.81 mg/dL (ref 0.44–1.00)
Creatinine, Ser: 0.66 mg/dL (ref 0.44–1.00)
GFR, Estimated: >60 mL/min (ref >60)
GFR, Estimated: >60 mL/min (ref >60)
GFR, Estimated: >60 mL/min (ref >60)
Glucose, Bld: 174 mg/dL — ABNORMAL HIGH (ref 70–99)
Glucose, Bld: 165 mg/dL — ABNORMAL HIGH (ref 70–99)
Glucose, Bld: 161 mg/dL — ABNORMAL HIGH (ref 70–99)
Potassium: 4 mmol/L (ref 3.5–5.1)
Potassium: 3.2 mmol/L — ABNORMAL LOW (ref 3.5–5.1)
Potassium: 3 mmol/L — ABNORMAL LOW (ref 3.5–5.1)
Sodium: 137 mmol/L (ref 135–145)
Sodium: 136 mmol/L (ref 135–145)
Sodium: 138 mmol/L (ref 135–145)

## 2023-04-05 LAB — BETA-HYDROXYBUTYRIC ACID
Beta-Hydroxybutyric Acid: 2.62 mmol/L — ABNORMAL HIGH (ref 0.05–0.27)
Beta-Hydroxybutyric Acid: 3.4 mmol/L — ABNORMAL HIGH (ref 0.05–0.27)

## 2023-04-05 LAB — GLUCOSE, CAPILLARY
Glucose-Capillary: 160 mg/dL — ABNORMAL HIGH (ref 70–99)
Glucose-Capillary: 169 mg/dL — ABNORMAL HIGH (ref 70–99)
Glucose-Capillary: 189 mg/dL — ABNORMAL HIGH (ref 70–99)
Glucose-Capillary: 168 mg/dL — ABNORMAL HIGH (ref 70–99)
Glucose-Capillary: 148 mg/dL — ABNORMAL HIGH (ref 70–99)
Glucose-Capillary: 140 mg/dL — ABNORMAL HIGH (ref 70–99)
Glucose-Capillary: 141 mg/dL — ABNORMAL HIGH (ref 70–99)
Glucose-Capillary: 145 mg/dL — ABNORMAL HIGH (ref 70–99)
Glucose-Capillary: 156 mg/dL — ABNORMAL HIGH (ref 70–99)
Glucose-Capillary: 149 mg/dL — ABNORMAL HIGH (ref 70–99)
Glucose-Capillary: 146 mg/dL — ABNORMAL HIGH (ref 70–99)
Glucose-Capillary: 127 mg/dL — ABNORMAL HIGH (ref 70–99)
Glucose-Capillary: 134 mg/dL — ABNORMAL HIGH (ref 70–99)
Glucose-Capillary: 151 mg/dL — ABNORMAL HIGH (ref 70–99)
Glucose-Capillary: 145 mg/dL — ABNORMAL HIGH (ref 70–99)
Glucose-Capillary: 122 mg/dL — ABNORMAL HIGH (ref 70–99)
Glucose-Capillary: 138 mg/dL — ABNORMAL HIGH (ref 70–99)

## 2023-04-05 LAB — CBC
HCT: 44 % (ref 36.0–46.0)
Hemoglobin: 14.7 g/dL (ref 12.0–15.0)
MCH: 28.7 pg (ref 26.0–34.0)
MCHC: 33.4 g/dL (ref 30.0–36.0)
MCV: 85.8 fL (ref 80.0–100.0)
Platelets: 221 10*3/uL (ref 150–400)
RBC: 5.13 MIL/uL — ABNORMAL HIGH (ref 3.87–5.11)
RDW: 14.5 % (ref 11.5–15.5)
WBC: 17.5 10*3/uL — ABNORMAL HIGH (ref 4.0–10.5)
nRBC: 0 % (ref 0.0–0.2)

## 2023-04-05 LAB — TROPONIN I (HIGH SENSITIVITY): Troponin I (High Sensitivity): 14 ng/L (ref <18)

## 2023-04-05 LAB — OSMOLALITY, URINE: Osmolality, Ur: 729 mosm/kg (ref 300–900)

## 2023-04-05 MED ORDER — ACETAMINOPHEN 325 MG PO TABS
650.0000 mg | ORAL_TABLET | Freq: Four times a day (QID) | ORAL | Status: DC | PRN
  Administered 2023-04-05 – 2023-04-07 (×2): 650 mg via ORAL
  Filled 2023-04-05 (×2): qty 2

## 2023-04-05 MED ORDER — DEXTROSE IN LACTATED RINGERS 5 % IV SOLN: INTRAVENOUS | Status: AC

## 2023-04-05 MED ORDER — ONDANSETRON HCL 4 MG/2ML IJ SOLN
4.0000 mg | Freq: Four times a day (QID) | INTRAMUSCULAR | Status: DC
  Administered 2023-04-05 – 2023-04-08 (×13): 4 mg via INTRAVENOUS
  Filled 2023-04-05 (×12): qty 2

## 2023-04-05 MED ORDER — POTASSIUM CHLORIDE 10 MEQ/100ML IV SOLN
10.0000 meq | INTRAVENOUS | Status: AC
  Administered 2023-04-05 (×6): 10 meq via INTRAVENOUS
  Filled 2023-04-05 (×6): qty 100

## 2023-04-05 MED ORDER — POTASSIUM CHLORIDE 20 MEQ PO PACK
40.0000 meq | PACK | ORAL | Status: AC
  Administered 2023-04-06 (×2): 40 meq via ORAL
  Filled 2023-04-05 (×3): qty 2

## 2023-04-05 MED ORDER — SODIUM CHLORIDE 0.9 % IV SOLN: INTRAVENOUS | Status: AC | PRN

## 2023-04-05 MED ORDER — LACTATED RINGERS IV BOLUS
1000.0000 mL | Freq: Once | INTRAVENOUS | Status: AC
  Administered 2023-04-05: 1000 mL via INTRAVENOUS

## 2023-04-05 MED ORDER — ONDANSETRON HCL 4 MG/2ML IJ SOLN
INTRAMUSCULAR | Status: AC
  Filled 2023-04-05: qty 2

## 2023-04-05 NOTE — Plan of Care (Signed)
  Problem: Education: Goal: Knowledge of General Education information will improve Description: Including pain rating scale, medication(s)/side effects and non-pharmacologic comfort measures Outcome: Progressing   Problem: Health Behavior/Discharge Planning: Goal: Ability to manage health-related needs will improve Outcome: Progressing   Problem: Clinical Measurements: Goal: Ability to maintain clinical measurements within normal limits will improve Outcome: Progressing Goal: Will remain free from infection Outcome: Progressing Goal: Diagnostic test results will improve Outcome: Progressing Goal: Respiratory complications will improve Outcome: Progressing Goal: Cardiovascular complication will be avoided Outcome: Progressing   Problem: Activity: Goal: Risk for activity intolerance will decrease Outcome: Progressing   Problem: Nutrition: Goal: Adequate nutrition will be maintained Outcome: Progressing   Problem: Coping: Goal: Level of anxiety will decrease Outcome: Progressing   Problem: Elimination: Goal: Will not experience complications related to bowel motility Outcome: Progressing Goal: Will not experience complications related to urinary retention Outcome: Progressing   Problem: Pain Management: Goal: General experience of comfort will improve Outcome: Progressing   Problem: Safety: Goal: Ability to remain free from injury will improve Outcome: Progressing   Problem: Skin Integrity: Goal: Risk for impaired skin integrity will decrease Outcome: Progressing   Problem: Education: Goal: Ability to describe self-care measures that may prevent or decrease complications (Diabetes Survival Skills Education) will improve Outcome: Progressing Goal: Individualized Educational Video(s) Outcome: Progressing   Problem: Coping: Goal: Ability to adjust to condition or change in health will improve Outcome: Progressing   Problem: Fluid Volume: Goal: Ability to  maintain a balanced intake and output will improve Outcome: Progressing   Problem: Health Behavior/Discharge Planning: Goal: Ability to identify and utilize available resources and services will improve Outcome: Progressing Goal: Ability to manage health-related needs will improve Outcome: Progressing   Problem: Metabolic: Goal: Ability to maintain appropriate glucose levels will improve Outcome: Progressing   Problem: Nutritional: Goal: Maintenance of adequate nutrition will improve Outcome: Progressing Goal: Progress toward achieving an optimal weight will improve Outcome: Progressing   Problem: Skin Integrity: Goal: Risk for impaired skin integrity will decrease Outcome: Progressing   Problem: Tissue Perfusion: Goal: Adequacy of tissue perfusion will improve Outcome: Progressing   Problem: Education: Goal: Ability to describe self-care measures that may prevent or decrease complications (Diabetes Survival Skills Education) will improve Outcome: Progressing Goal: Individualized Educational Video(s) Outcome: Progressing   Problem: Cardiac: Goal: Ability to maintain an adequate cardiac output will improve Outcome: Progressing   Problem: Health Behavior/Discharge Planning: Goal: Ability to identify and utilize available resources and services will improve Outcome: Progressing Goal: Ability to manage health-related needs will improve Outcome: Progressing   Problem: Fluid Volume: Goal: Ability to achieve a balanced intake and output will improve Outcome: Progressing   Problem: Metabolic: Goal: Ability to maintain appropriate glucose levels will improve Outcome: Progressing   Problem: Nutritional: Goal: Maintenance of adequate nutrition will improve Outcome: Progressing Goal: Maintenance of adequate weight for body size and type will improve Outcome: Progressing   Problem: Respiratory: Goal: Will regain and/or maintain adequate ventilation Outcome: Progressing    Problem: Urinary Elimination: Goal: Ability to achieve and maintain adequate renal perfusion and functioning will improve Outcome: Progressing

## 2023-04-05 NOTE — Inpatient Diabetes Management (Signed)
Inpatient Diabetes Program Recommendations  AACE/ADA: New Consensus Statement on Inpatient Glycemic Control (2015)  Target Ranges:  Prepandial:   less than 140 mg/dL      Peak postprandial:   less than 180 mg/dL (1-2 hours)      Critically ill patients:  140 - 180 mg/dL   Lab Results  Component Value Date   GLUCAP 149 (H) 04/05/2023    Review of Glycemic Control  Diabetes history: DM2 Outpatient Diabetes medications: Jardiance 25 daily, metformin 1000 mg BID, Ozempic 1 mg weekly,  Current orders for Inpatient glycemic control: IV insulin per [employer] for DKA  HgbA1C pending Last BHB - 2.62 at 0544  Inpatient Diabetes Program Recommendations:   When criteria met for discontinuation of IV insulin, give basal insulin 1-2H prior to drip being stopped.  Consider:  Semglee 10-12 units Q24H  Novolog 0-15 Q4H, then TID with meals and 0-5 HS when eating  Will likely need meal coverage insulin.  Spoke with pt and family regarding pt's diabetes meds. Pt has lost approx 50 pounds since start of taking Ozempic. Father states pt was doing well with taking meds and checking blood sugars, but within the last couple of weeks, has not been consistent.   Will talk with family regarding her diabetes control when HgbA1C results are in.  Continue to follow.  Thank you. Ailene Ards, RD, LDN, CDCES Inpatient Diabetes Coordinator [phone]

## 2023-04-05 NOTE — Progress Notes (Signed)
NAME:  Shelia Lin, MRN:  [id number], DOB:  [birth year], LOS: 1 ADMISSION DATE:  04/04/2023, CONSULTATION DATE:  04/04/23 REFERRING MD:  EDP, CHIEF COMPLAINT:  URI   History of Present Illness:  47 year old female with intellectual disability, DM2, HLD, chronic abdominal pain and GERD who presented with flu like symptoms and N/V. Dad reports she has not eaten much in the last four days. She has a caretaker and patient has been taking her DM2 medications as far as he knows. In the [facility] ED, she was found to be altered and acidemic with kussmaul respirations. Protecting airway. Urine with ketones.  Started on fluids and insulin drip.  PCCM to admit to ICU for DKA, sepsis.  Given broad spectrum abx prior to transfer.  CT A/P negative for acute abnormalities. Labs significant for ABG 7.10/19/55. CO2 <7, glucose 257, BHA >8. S/p 5L LR and started on mIVF and insulin gtt. Patient transferred to [facility] for admission.  Pertinent  Medical History  DM2  Significant Hospital Events: Including procedures, antibiotic start and stop dates in addition to other pertinent events   12/29 admit DKA 12/30 No issues overnight, remains on insulin drip with persistently elevated Beta hydroxy and CO2  Interim History / Subjective:  Seen sitting up in bed with no acute complaints, family at bedside   Objective   Blood pressure (!) 158/74, pulse (!) 101, temperature 98.7 F (37.1 C), temperature source Oral, resp. rate (!) 25, height 5\' 3"  (1.6 m), weight 92.7 kg, SpO2 98%.        Intake/Output Summary (Last 24 hours) at 04/05/2023 0717 Last data filed at 04/05/2023 4742 Gross per 24 hour  Intake 6217.03 ml  Output 4900 ml  Net 1317.03 ml   Filed Weights   04/04/23 1132 04/04/23 1530  Weight: 88.9 kg 92.7 kg   Physical Exam: General: Acute ill appearing adult female sitting up in bed , in NAD HEENT: [place]/AT, MM pink/moist, PERRL,  Neuro: Alert and oriented x3, non-focal  CV: s1s2 regular rate and rhythm,  no murmur, rubs, or gallops,  PULM:  Clear to auscultation, no increased work of breathing, mild tachypnea  GI: soft, bowel sounds active in all 4 quadrants, non-tender, non-distended Extremities: warm/dry, no edema  Skin: no rashes or lesions  Resolved Hospital Problem list   Acute respiratory distress due to severe acidemia Acute kidney injury  Hypovolemia   Assessment & Plan:  Acute metabolic encephalopathy - Improved  -Secondary to sepsis, DKA History of intelectual disability  P: Maintain neuro protective measures Nutrition and bowel regiment  Aspirations precautions  Medical management as below    Anion gap acidemia with ketones in urine presumed euglycemic DKA P: Aggressive IV hydration provided on admission, with ongoing tachycardia will give on additional liter of fluid now  Continue Insulin drip as beta hydrox and CO2 remain elevated  Closely monitor patient's electrolytes  Maintain potassium greater than 4 Accu-Cheks every 1 hour Once blood glucose falls below 250 start patient on D5 IV fluids When Anion gap closes and patient is able to tolerate oral diet transition to subcu long-acting insulin in slowly turned drip off over the next 1-2 hours Monitor renal function Monitor lactate Check hemoglobin A1c  NPO/noncaloric clears until gap closed  Sepsis with viral-type illness preceding, N/V SOB - RVP negative. CT A/P neg for acute issues P: Continue Zosyn and Doxy  Follow procal  Follow cultures and RVP panel   Query rhabdo with large blood but neg RBC on  urine -CK 362 on admission.  CT A/P negative  P: Repeat CK in am  Ongoing IV hydration   Best Practice (right click and "Reselect all SmartList Selections" daily)   Diet/type: NPO DVT prophylaxis LMWH Pressure ulcer(s): N/A GI prophylaxis: N/A Lines: N/A Foley:  Yes, and it is still needed Code Status:  full code Last date of multidisciplinary goals of care discussion [pending]  Critical care time:  NA   [place] Lemaster D. Harris, NP-C [place] Personal contact information can be found on Amion  If no contact or response made please call [phone] 04/05/2023, 7:28 AM

## 2023-04-05 NOTE — Plan of Care (Signed)
  Problem: Clinical Measurements: Goal: Cardiovascular complication will be avoided Outcome: Progressing   Problem: Activity: Goal: Risk for activity intolerance will decrease Outcome: Progressing   Problem: Nutrition: Goal: Adequate nutrition will be maintained Outcome: Progressing   

## 2023-04-05 NOTE — Progress Notes (Signed)
eLink Physician-Brief Progress Note Patient Name: Shelia Lin DOB: 1975-04-17 MRN: [id number]   Date of Service  04/05/2023  HPI/Events of Note  pain  eICU Interventions     Ear pain Will start tylenol    Massie Maroon 04/05/2023, 1:53 AM

## 2023-04-06 ENCOUNTER — Other Ambulatory Visit: Payer: Self-pay

## 2023-04-06 DIAGNOSIS — G9340 Encephalopathy, unspecified: Secondary | ICD-10-CM

## 2023-04-06 DIAGNOSIS — E1110 Type 2 diabetes mellitus with ketoacidosis without coma: Secondary | ICD-10-CM | POA: Diagnosis not present

## 2023-04-06 DIAGNOSIS — E876 Hypokalemia: Secondary | ICD-10-CM

## 2023-04-06 LAB — GLUCOSE, CAPILLARY
Glucose-Capillary: 119 mg/dL — ABNORMAL HIGH (ref 70–99)
Glucose-Capillary: 122 mg/dL — ABNORMAL HIGH (ref 70–99)
Glucose-Capillary: 124 mg/dL — ABNORMAL HIGH (ref 70–99)
Glucose-Capillary: 131 mg/dL — ABNORMAL HIGH (ref 70–99)
Glucose-Capillary: 137 mg/dL — ABNORMAL HIGH (ref 70–99)
Glucose-Capillary: 138 mg/dL — ABNORMAL HIGH (ref 70–99)
Glucose-Capillary: 140 mg/dL — ABNORMAL HIGH (ref 70–99)
Glucose-Capillary: 142 mg/dL — ABNORMAL HIGH (ref 70–99)
Glucose-Capillary: 145 mg/dL — ABNORMAL HIGH (ref 70–99)
Glucose-Capillary: 145 mg/dL — ABNORMAL HIGH (ref 70–99)
Glucose-Capillary: 146 mg/dL — ABNORMAL HIGH (ref 70–99)
Glucose-Capillary: 150 mg/dL — ABNORMAL HIGH (ref 70–99)
Glucose-Capillary: 156 mg/dL — ABNORMAL HIGH (ref 70–99)
Glucose-Capillary: 158 mg/dL — ABNORMAL HIGH (ref 70–99)
Glucose-Capillary: 164 mg/dL — ABNORMAL HIGH (ref 70–99)
Glucose-Capillary: 167 mg/dL — ABNORMAL HIGH (ref 70–99)
Glucose-Capillary: 171 mg/dL — ABNORMAL HIGH (ref 70–99)

## 2023-04-06 LAB — BASIC METABOLIC PANEL
Anion gap: 13 (ref 5–15)
Anion gap: 12 (ref 5–15)
Anion gap: 14 (ref 5–15)
BUN: 6 mg/dL (ref 6–20)
BUN: 6 mg/dL (ref 6–20)
BUN: 7 mg/dL (ref 6–20)
CO2: 14 mmol/L — ABNORMAL LOW (ref 22–32)
CO2: 16 mmol/L — ABNORMAL LOW (ref 22–32)
CO2: 17 mmol/L — ABNORMAL LOW (ref 22–32)
Calcium: 8.6 mg/dL — ABNORMAL LOW (ref 8.9–10.3)
Calcium: 8.8 mg/dL — ABNORMAL LOW (ref 8.9–10.3)
Calcium: 9.2 mg/dL (ref 8.9–10.3)
Chloride: 108 mmol/L (ref 98–111)
Chloride: 111 mmol/L (ref 98–111)
Chloride: 110 mmol/L (ref 98–111)
Creatinine, Ser: 0.61 mg/dL (ref 0.44–1.00)
Creatinine, Ser: 0.63 mg/dL (ref 0.44–1.00)
Creatinine, Ser: 0.63 mg/dL (ref 0.44–1.00)
GFR, Estimated: >60 mL/min (ref >60)
GFR, Estimated: >60 mL/min (ref >60)
GFR, Estimated: >60 mL/min (ref >60)
Glucose, Bld: 155 mg/dL — ABNORMAL HIGH (ref 70–99)
Glucose, Bld: 181 mg/dL — ABNORMAL HIGH (ref 70–99)
Glucose, Bld: 155 mg/dL — ABNORMAL HIGH (ref 70–99)
Potassium: 2.7 mmol/L — CL (ref 3.5–5.1)
Potassium: 3 mmol/L — ABNORMAL LOW (ref 3.5–5.1)
Potassium: 3.1 mmol/L — ABNORMAL LOW (ref 3.5–5.1)
Sodium: 135 mmol/L (ref 135–145)
Sodium: 139 mmol/L (ref 135–145)
Sodium: 141 mmol/L (ref 135–145)

## 2023-04-06 LAB — CK TOTAL AND CKMB (NOT AT ARMC)
CK, MB: 3.8 ng/mL (ref 0.5–5.0)
Total CK: 71 U/L (ref 38–234)

## 2023-04-06 LAB — CBC
HCT: 41 % (ref 36.0–46.0)
Hemoglobin: 13.4 g/dL (ref 12.0–15.0)
MCH: 28.4 pg (ref 26.0–34.0)
MCHC: 32.7 g/dL (ref 30.0–36.0)
MCV: 86.9 fL (ref 80.0–100.0)
Platelets: 195 10*3/uL (ref 150–400)
RBC: 4.72 MIL/uL (ref 3.87–5.11)
RDW: 14.8 % (ref 11.5–15.5)
WBC: 8.8 10*3/uL (ref 4.0–10.5)
nRBC: 0 % (ref 0.0–0.2)

## 2023-04-06 LAB — HEMOGLOBIN A1C
Hgb A1c MFr Bld: 9.3 % — ABNORMAL HIGH (ref 4.8–5.6)
Hgb A1c MFr Bld: 9.4 % — ABNORMAL HIGH (ref 4.8–5.6)
Mean Plasma Glucose: 220 mg/dL
Mean Plasma Glucose: 223 mg/dL

## 2023-04-06 LAB — TROPONIN I (HIGH SENSITIVITY): Troponin I (High Sensitivity): 16 ng/L (ref <18)

## 2023-04-06 LAB — MAGNESIUM: Magnesium: 2.2 mg/dL (ref 1.7–2.4)

## 2023-04-06 LAB — BETA-HYDROXYBUTYRIC ACID
Beta-Hydroxybutyric Acid: 2.78 mmol/L — ABNORMAL HIGH (ref 0.05–0.27)
Beta-Hydroxybutyric Acid: 4.67 mmol/L — ABNORMAL HIGH (ref 0.05–0.27)

## 2023-04-06 MED ORDER — POTASSIUM CHLORIDE CRYS ER 20 MEQ PO TBCR
60.0000 meq | EXTENDED_RELEASE_TABLET | Freq: Once | ORAL | Status: AC
  Administered 2023-04-06: 60 meq via ORAL
  Filled 2023-04-06: qty 3

## 2023-04-06 MED ORDER — MAGNESIUM SULFATE 2 GM/50ML IV SOLN
2.0000 g | Freq: Once | INTRAVENOUS | Status: AC
  Administered 2023-04-06: 2 g via INTRAVENOUS
  Filled 2023-04-06: qty 50

## 2023-04-06 MED ORDER — SODIUM CHLORIDE 0.9% FLUSH
10.0000 mL | Freq: Two times a day (BID) | INTRAVENOUS | Status: DC
Start: 2023-04-06 — End: 2023-04-10
  Administered 2023-04-06 – 2023-04-08 (×4): 10 mL
  Administered 2023-04-08: 20 mL
  Administered 2023-04-09 – 2023-04-10 (×2): 10 mL

## 2023-04-06 MED ORDER — PROCHLORPERAZINE EDISYLATE 10 MG/2ML IJ SOLN
10.0000 mg | Freq: Four times a day (QID) | INTRAMUSCULAR | Status: DC | PRN
  Administered 2023-04-06: 10 mg via INTRAVENOUS
  Filled 2023-04-06: qty 2

## 2023-04-06 MED ORDER — POTASSIUM CHLORIDE 10 MEQ/100ML IV SOLN: 10.0000 meq | INTRAVENOUS | Status: DC

## 2023-04-06 MED ORDER — METOCLOPRAMIDE HCL 5 MG/ML IJ SOLN
10.0000 mg | Freq: Three times a day (TID) | INTRAMUSCULAR | Status: AC
  Administered 2023-04-06 (×2): 10 mg via INTRAVENOUS
  Filled 2023-04-06 (×2): qty 2

## 2023-04-06 MED ORDER — DEXTROSE IN LACTATED RINGERS 5 % IV SOLN: INTRAVENOUS | Status: AC

## 2023-04-06 MED ORDER — SODIUM CHLORIDE 0.9% FLUSH: 10.0000 mL | INTRAVENOUS | Status: DC | PRN

## 2023-04-06 NOTE — Plan of Care (Signed)
 Patient lost IV access. With DKA requiring insulin  gtt and frequent labs, needs reliable access -- I think PICC would be most appropriate. Spoke with parents on the phone about this vs a PIV and they are in agreement with moving forward with PICC.   Urgent IV team consult placed    Ronnald Gave MSN, AGACNP-BC [facility] 04/06/2023, 9:57 AM

## 2023-04-06 NOTE — Plan of Care (Signed)
  Problem: Activity: Goal: Risk for activity intolerance will decrease Outcome: Progressing   Problem: Coping: Goal: Level of anxiety will decrease Outcome: Progressing   Problem: Elimination: Goal: Will not experience complications related to urinary retention Outcome: Progressing   Problem: Safety: Goal: Ability to remain free from injury will improve Outcome: Progressing   Problem: Skin Integrity: Goal: Risk for impaired skin integrity will decrease Outcome: Progressing   Problem: Tissue Perfusion: Goal: Adequacy of tissue perfusion will improve Outcome: Progressing   Problem: Fluid Volume: Goal: Ability to achieve a balanced intake and output will improve Outcome: Progressing

## 2023-04-06 NOTE — Progress Notes (Signed)
 NAME:  Shelia Lin, MRN:  [id number], DOB:  1975/08/08, LOS: 2 ADMISSION DATE:  04/04/2023, CONSULTATION DATE:  04/04/23 REFERRING MD:  EDP, CHIEF COMPLAINT:  URI   History of Present Illness:  47 year old female with intellectual disability, DM2, HLD, chronic abdominal pain and GERD who presented with flu like symptoms and N/V. Dad reports she has not eaten much in the last four days. She has a caretaker and patient has been taking her DM2 medications as far as he knows. In the [facility] ED, she was found to be altered and acidemic with kussmaul respirations. Protecting airway. Urine with ketones.  Started on fluids and insulin  drip.  PCCM to admit to ICU for DKA, sepsis.  Given broad spectrum abx prior to transfer.  CT A/P negative for acute abnormalities. Labs significant for ABG 7.10/19/55. CO2 <7, glucose 257, BHA >8. S/p 5L LR and started on mIVF and insulin  gtt. Patient transferred to [facility] for admission.  Pertinent  Medical History  DM2  Significant Hospital Events: Including procedures, antibiotic start and stop dates in addition to other pertinent events   12/29 admit DKA 12/30 No issues overnight, remains on insulin  drip with persistently elevated Beta hydroxy and low CO2 12/31 PICC consult   Interim History / Subjective:  Serum bicarb is 16, bhb 2.78  K 3   Objective   Blood pressure (!) 151/79, pulse 100, temperature (!) 97.5 F (36.4 C), temperature source Oral, resp. rate (!) 27, height 5' 3 (1.6 m), weight 92.7 kg, SpO2 93%.        Intake/Output Summary (Last 24 hours) at 04/06/2023 1130 Last data filed at 04/06/2023 1100 Gross per 24 hour  Intake 3551.29 ml  Output 3650 ml  Net -98.71 ml   Filed Weights   04/04/23 1132 04/04/23 1530  Weight: 88.9 kg 92.7 kg   Physical Exam: General: Acutely ill obese middle aged F NAD  HEENT: NCAT pink mm  Neuro: Awake alert but some disorientation  CV: rr s1s2  PULM:  even unlabored, symmetrical chest expansion  GI: soft  abdomen  Extremities: no acute joint deformity, no cyanosis or clubbing  Skin: flushed face. C/d/w   Resolved Hospital Problem list   Acute respiratory distress due to severe acidemia Acute kidney injury  Hypovolemia   Assessment & Plan:   Hx intellectual disability Metabolic encephalopathy  -Secondary to sepsis, DKA P: - delirium precautions  DKA - euglycemic (query component of ketoacidosis r/t ozempic/wt loss?) NAGMA  Hypokalemia Difficult IV access  P -cont insulin  gtt -giv 2g map empirically -replace K  -follow BMP -- will check a mag on next BMP and again in AM  -DM coord consulted  -IV team consulted for PICC 12/31   N/v Possible sepsis (n/v -- could be abd source, unable to r/o)  -query gastroparesis -- presenting CT a/p w a pretty full stomach for somebody coming in w CC n/v. Is diabetic, and is on ozempic --- consider gastroparesis related to either/both of these P [facility] reglan  12/31 -supportive care -zosyn  doxy   AKI, improved Elevated CK  -CK 362 on admission P: -trend renal indices, UOP  -AM CK   Best Practice (right click and Reselect all SmartList Selections daily)   Diet/type: NPO DVT prophylaxis LMWH Pressure ulcer(s): N/A GI prophylaxis: N/A Lines: N/A Foley:  Yes, and it is still needed Code Status:  full code Last date of multidisciplinary goals of care discussion [parents updated 12/31  Critical care time:    CRITICAL  CARE Performed by: Ronnald FORBES Gave   Total critical care time: 38 minutes  Critical care time was exclusive of separately billable procedures and treating other patients. Critical care was necessary to treat or prevent imminent or life-threatening deterioration.  Critical care was time spent personally by me on the following activities: development of treatment plan with patient and/or surrogate as well as nursing, discussions with consultants, evaluation of patient's response to treatment, examination of patient,  obtaining history from patient or surrogate, ordering and performing treatments and interventions, ordering and review of laboratory studies, ordering and review of radiographic studies, pulse oximetry and re-evaluation of patient's condition.  Ronnald Gave MSN, AGACNP-BC [place] [facility] Amion for pager  04/06/2023, 11:30 AM

## 2023-04-06 NOTE — Plan of Care (Signed)
  Problem: Activity: Goal: Risk for activity intolerance will decrease Outcome: Progressing   Problem: Safety: Goal: Ability to remain free from injury will improve Outcome: Progressing   Problem: Skin Integrity: Goal: Risk for impaired skin integrity will decrease Outcome: Progressing   Problem: Respiratory: Goal: Will regain and/or maintain adequate ventilation Outcome: Progressing   Problem: Urinary Elimination: Goal: Ability to achieve and maintain adequate renal perfusion and functioning will improve Outcome: Progressing

## 2023-04-06 NOTE — Progress Notes (Signed)
 Peripherally Inserted Central Catheter Placement  The IV Nurse has discussed with the patient and/or persons authorized to consent for the patient, the purpose of this procedure and the potential benefits and risks involved with this procedure.  The benefits include less needle sticks, lab draws from the catheter, and the patient may be discharged home with the catheter. Risks include, but not limited to, infection, bleeding, blood clot (thrombus formation), and puncture of an artery; nerve damage and irregular heartbeat and possibility to perform a PICC exchange if needed/ordered by physician.  Alternatives to this procedure were also discussed.  Bard Power PICC patient education guide, fact sheet on infection prevention and patient information card has been provided to patient /or left at bedside.    PICC Placement Documentation  PICC Triple Lumen 04/06/23 Right Brachial 38 cm 1 cm (Active)  Indication for Insertion or Continuance of Line Poor Vasculature-patient has had multiple peripheral attempts or PIVs lasting less than 24 hours 04/06/23 1500  Exposed Catheter (cm) 1 cm 04/06/23 1500  Site Assessment Clean, Dry, Intact 04/06/23 1500  Lumen #1 Status Flushed;Saline locked;Blood return noted 04/06/23 1500  Lumen #2 Status Flushed;Saline locked;Blood return noted 04/06/23 1500  Lumen #3 Status Flushed;Saline locked;Blood return noted 04/06/23 1500  Dressing Type Transparent;Securing device 04/06/23 1500  Dressing Status Antimicrobial disc in place;Clean, Dry, Intact 04/06/23 1500  Line Care Connections checked and tightened 04/06/23 1500  Line Adjustment (NICU/IV Team Only) No 04/06/23 1500  Dressing Intervention New dressing 04/06/23 1500  Dressing Change Due 04/13/23 04/06/23 1500       Shelia Lin 04/06/2023, 3:58 PM

## 2023-04-07 DIAGNOSIS — E1110 Type 2 diabetes mellitus with ketoacidosis without coma: Secondary | ICD-10-CM | POA: Diagnosis not present

## 2023-04-07 LAB — GLUCOSE, CAPILLARY
Glucose-Capillary: 104 mg/dL — ABNORMAL HIGH (ref 70–99)
Glucose-Capillary: 141 mg/dL — ABNORMAL HIGH (ref 70–99)
Glucose-Capillary: 145 mg/dL — ABNORMAL HIGH (ref 70–99)
Glucose-Capillary: 147 mg/dL — ABNORMAL HIGH (ref 70–99)
Glucose-Capillary: 150 mg/dL — ABNORMAL HIGH (ref 70–99)
Glucose-Capillary: 151 mg/dL — ABNORMAL HIGH (ref 70–99)
Glucose-Capillary: 152 mg/dL — ABNORMAL HIGH (ref 70–99)
Glucose-Capillary: 153 mg/dL — ABNORMAL HIGH (ref 70–99)
Glucose-Capillary: 164 mg/dL — ABNORMAL HIGH (ref 70–99)
Glucose-Capillary: 164 mg/dL — ABNORMAL HIGH (ref 70–99)
Glucose-Capillary: 577 mg/dL (ref 70–99)
Glucose-Capillary: 73 mg/dL (ref 70–99)

## 2023-04-07 LAB — HEPATIC FUNCTION PANEL
ALT: 19 U/L (ref 0–44)
AST: 15 U/L (ref 15–41)
Albumin: 3.1 g/dL — ABNORMAL LOW (ref 3.5–5.0)
Alkaline Phosphatase: 51 U/L (ref 38–126)
Bilirubin, Direct: 0.1 mg/dL (ref 0.0–0.2)
Indirect Bilirubin: 0.8 mg/dL (ref 0.3–0.9)
Total Bilirubin: 0.9 mg/dL (ref 0.0–1.2)
Total Protein: 6.3 g/dL — ABNORMAL LOW (ref 6.5–8.1)

## 2023-04-07 LAB — BASIC METABOLIC PANEL
Anion gap: 10 (ref 5–15)
Anion gap: 6 (ref 5–15)
Anion gap: 9 (ref 5–15)
Anion gap: 9 (ref 5–15)
Anion gap: 9 (ref 5–15)
BUN: 6 mg/dL (ref 6–20)
BUN: 6 mg/dL (ref 6–20)
BUN: 6 mg/dL (ref 6–20)
BUN: <5 mg/dL — ABNORMAL LOW (ref 6–20)
BUN: <5 mg/dL — ABNORMAL LOW (ref 6–20)
CO2: 20 mmol/L — ABNORMAL LOW (ref 22–32)
CO2: 22 mmol/L (ref 22–32)
CO2: 23 mmol/L (ref 22–32)
CO2: 24 mmol/L (ref 22–32)
CO2: 25 mmol/L (ref 22–32)
Calcium: 8.4 mg/dL — ABNORMAL LOW (ref 8.9–10.3)
Calcium: 8.6 mg/dL — ABNORMAL LOW (ref 8.9–10.3)
Calcium: 8.7 mg/dL — ABNORMAL LOW (ref 8.9–10.3)
Calcium: 8.8 mg/dL — ABNORMAL LOW (ref 8.9–10.3)
Calcium: 8.8 mg/dL — ABNORMAL LOW (ref 8.9–10.3)
Chloride: 104 mmol/L (ref 98–111)
Chloride: 107 mmol/L (ref 98–111)
Chloride: 107 mmol/L (ref 98–111)
Chloride: 108 mmol/L (ref 98–111)
Chloride: 112 mmol/L — ABNORMAL HIGH (ref 98–111)
Creatinine, Ser: 0.4 mg/dL — ABNORMAL LOW (ref 0.44–1.00)
Creatinine, Ser: 0.42 mg/dL — ABNORMAL LOW (ref 0.44–1.00)
Creatinine, Ser: 0.47 mg/dL (ref 0.44–1.00)
Creatinine, Ser: 0.48 mg/dL (ref 0.44–1.00)
Creatinine, Ser: 0.56 mg/dL (ref 0.44–1.00)
GFR, Estimated: >60 mL/min (ref >60)
GFR, Estimated: >60 mL/min (ref >60)
GFR, Estimated: >60 mL/min (ref >60)
GFR, Estimated: >60 mL/min (ref >60)
GFR, Estimated: >60 mL/min (ref >60)
Glucose, Bld: 158 mg/dL — ABNORMAL HIGH (ref 70–99)
Glucose, Bld: 158 mg/dL — ABNORMAL HIGH (ref 70–99)
Glucose, Bld: 158 mg/dL — ABNORMAL HIGH (ref 70–99)
Glucose, Bld: 184 mg/dL — ABNORMAL HIGH (ref 70–99)
Glucose, Bld: 189 mg/dL — ABNORMAL HIGH (ref 70–99)
Potassium: 2.8 mmol/L — ABNORMAL LOW (ref 3.5–5.1)
Potassium: 3 mmol/L — ABNORMAL LOW (ref 3.5–5.1)
Potassium: 3.1 mmol/L — ABNORMAL LOW (ref 3.5–5.1)
Potassium: 3.5 mmol/L (ref 3.5–5.1)
Potassium: 3.7 mmol/L (ref 3.5–5.1)
Sodium: 135 mmol/L (ref 135–145)
Sodium: 138 mmol/L (ref 135–145)
Sodium: 140 mmol/L (ref 135–145)
Sodium: 140 mmol/L (ref 135–145)
Sodium: 142 mmol/L (ref 135–145)

## 2023-04-07 LAB — CBC
HCT: 36.9 % (ref 36.0–46.0)
Hemoglobin: 12.5 g/dL (ref 12.0–15.0)
MCH: 28.9 pg (ref 26.0–34.0)
MCHC: 33.9 g/dL (ref 30.0–36.0)
MCV: 85.4 fL (ref 80.0–100.0)
Platelets: 175 10*3/uL (ref 150–400)
RBC: 4.32 MIL/uL (ref 3.87–5.11)
RDW: 14.8 % (ref 11.5–15.5)
WBC: 6.5 10*3/uL (ref 4.0–10.5)
nRBC: 0 % (ref 0.0–0.2)

## 2023-04-07 LAB — BETA-HYDROXYBUTYRIC ACID
Beta-Hydroxybutyric Acid: 1.09 mmol/L — ABNORMAL HIGH (ref 0.05–0.27)
Beta-Hydroxybutyric Acid: 1.54 mmol/L — ABNORMAL HIGH (ref 0.05–0.27)
Beta-Hydroxybutyric Acid: 2.14 mmol/L — ABNORMAL HIGH (ref 0.05–0.27)
Beta-Hydroxybutyric Acid: 3.77 mmol/L — ABNORMAL HIGH (ref 0.05–0.27)

## 2023-04-07 LAB — MAGNESIUM: Magnesium: 2.2 mg/dL (ref 1.7–2.4)

## 2023-04-07 LAB — CK: Total CK: 44 U/L (ref 38–234)

## 2023-04-07 MED ORDER — POTASSIUM CHLORIDE CRYS ER 20 MEQ PO TBCR
60.0000 meq | EXTENDED_RELEASE_TABLET | Freq: Four times a day (QID) | ORAL | Status: AC
  Administered 2023-04-07 (×2): 60 meq via ORAL
  Filled 2023-04-07 (×2): qty 3

## 2023-04-07 NOTE — Progress Notes (Signed)
 eLink Physician-Brief Progress Note Patient Name: Shelia Lin DOB: 11/26/1975 MRN: [id number]   Date of Service  04/07/2023  HPI/Events of Note  Presented with sepsis complicated by acute kidney injury and diabetic ketoacidosis.  Bicarb has normalized, anion gap is normalized, but still has substantial ketones and blood.  eICU Interventions  Will wait to transition the patient for the time being-maintain insulin  infusion.  Replete potassium  If she can eat breakfast and keep it down, can transition in the morning.     Intervention Category Minor Interventions: Clinical assessment - ordering diagnostic tests  Rodolfo Notaro 04/07/2023, 2:49 AM

## 2023-04-07 NOTE — Progress Notes (Signed)
 NAME:  Shelia Lin, MRN:  [id number], DOB:  1975-12-09, LOS: 3 ADMISSION DATE:  04/04/2023, CONSULTATION DATE:  04/04/23 REFERRING MD:  EDP, CHIEF COMPLAINT:  URI   History of Present Illness:  48 year old female with intellectual disability, DM2, HLD, chronic abdominal pain and GERD who presented with flu like symptoms and N/V. Dad reports she has not eaten much in the last four days. She has a caretaker and patient has been taking her DM2 medications as far as he knows. In the [facility] ED, she was found to be altered and acidemic with kussmaul respirations. Protecting airway. Urine with ketones.  Started on fluids and insulin  drip.  PCCM to admit to ICU for DKA, sepsis.  Given broad spectrum abx prior to transfer.  CT A/P negative for acute abnormalities. Labs significant for ABG 7.10/19/55. CO2 <7, glucose 257, BHA >8. S/p 5L LR and started on mIVF and insulin  gtt. Patient transferred to [facility] for admission.  Pertinent  Medical History  DM2  Significant Hospital Events: Including procedures, antibiotic start and stop dates in addition to other pertinent events   12/29 admit DKA 12/30 No issues overnight, remains on insulin  drip with persistently elevated Beta hydroxy and low CO2 12/31 PICC consult   Interim History / Subjective:  Slow to improve DKA Elink approved breakfast this am Family reports patient too drowsy last night after taking reglan  and would like to hold  Objective   Blood pressure (!) 157/93, pulse 88, temperature 98.5 F (36.9 C), temperature source Oral, resp. rate 15, height 5' 3 (1.6 m), weight 92.7 kg, SpO2 97%.        Intake/Output Summary (Last 24 hours) at 04/07/2023 0911 Last data filed at 04/07/2023 0842 Gross per 24 hour  Intake 2707.47 ml  Output 2800 ml  Net -92.53 ml   Filed Weights   04/04/23 1132 04/04/23 1530  Weight: 88.9 kg 92.7 kg   Physical Exam: General: Well-appearing, no acute distress,  ate breakfast earlier HENT: [place], AT, OP clear,  MMM Eyes: EOMI, no scleral icterus Respiratory: Clear to auscultation bilaterally.  No crackles, wheezing or rales Cardiovascular: RRR, -M/R/G, no JVD GI: BS+, soft, nontender Extremities:-Edema,-tenderness Neuro: Awake and alert and answers questions, CNII-XII grossly intact  K 2.8 Mg 2.2 BHA 2.14  Resolved Hospital Problem list   Acute respiratory distress due to severe acidemia Acute kidney injury  Hypovolemia  Elevated CK  Assessment & Plan:   Hx intellectual disability Metabolic encephalopathy  -Secondary to sepsis, DKA P: - delirium precautions  DKA - euglycemic (query component of ketoacidosis r/t ozempic/wt loss?) NAGMA  Hypokalemia Difficult IV access  P -Insulin  and fluids per protocol. Potentially transition off insulin  today or tomorrow -Continue BMET, CBG, BHA -Replete potassium -DM coord consulted   N/v Possible sepsis (n/v -- could be abd source, unable to r/o)  -query gastroparesis -- presenting CT a/p w a pretty full stomach for somebody coming in w CC n/v. Is diabetic, and is on ozempic --- consider gastroparesis related to either/both of these P -Dc'd reglan  -supportive care -zosyn . Plan for 5 day course ( end 1/3) -Doxy. Plan for 7 day course  Best Practice (right click and Reselect all SmartList Selections daily)   Diet/type: Regular consistency (see orders) DVT prophylaxis LMWH Pressure ulcer(s): N/A GI prophylaxis: N/A Lines: N/A Foley:  Yes, and it is still needed Code Status:  full code Last date of multidisciplinary goals of care discussion [parents updated 12/31  Critical care time:  Care Time: 70 min  Slater Staff, M.D. [facility] 04/07/2023 9:11 AM   See Amion for personal pager For hours between 7 PM to 7 AM, please call Elink for urgent questions

## 2023-04-08 DIAGNOSIS — E876 Hypokalemia: Secondary | ICD-10-CM | POA: Diagnosis not present

## 2023-04-08 DIAGNOSIS — G9340 Encephalopathy, unspecified: Secondary | ICD-10-CM | POA: Diagnosis not present

## 2023-04-08 DIAGNOSIS — E1110 Type 2 diabetes mellitus with ketoacidosis without coma: Secondary | ICD-10-CM | POA: Diagnosis not present

## 2023-04-08 LAB — GLUCOSE, CAPILLARY
Glucose-Capillary: 104 mg/dL — ABNORMAL HIGH (ref 70–99)
Glucose-Capillary: 113 mg/dL — ABNORMAL HIGH (ref 70–99)
Glucose-Capillary: 114 mg/dL — ABNORMAL HIGH (ref 70–99)
Glucose-Capillary: 117 mg/dL — ABNORMAL HIGH (ref 70–99)
Glucose-Capillary: 118 mg/dL — ABNORMAL HIGH (ref 70–99)
Glucose-Capillary: 118 mg/dL — ABNORMAL HIGH (ref 70–99)
Glucose-Capillary: 121 mg/dL — ABNORMAL HIGH (ref 70–99)
Glucose-Capillary: 123 mg/dL — ABNORMAL HIGH (ref 70–99)
Glucose-Capillary: 142 mg/dL — ABNORMAL HIGH (ref 70–99)
Glucose-Capillary: 145 mg/dL — ABNORMAL HIGH (ref 70–99)
Glucose-Capillary: 154 mg/dL — ABNORMAL HIGH (ref 70–99)
Glucose-Capillary: 162 mg/dL — ABNORMAL HIGH (ref 70–99)
Glucose-Capillary: 71 mg/dL (ref 70–99)
Glucose-Capillary: 79 mg/dL (ref 70–99)
Glucose-Capillary: 94 mg/dL (ref 70–99)

## 2023-04-08 LAB — BASIC METABOLIC PANEL
Anion gap: 12 (ref 5–15)
Anion gap: 8 (ref 5–15)
BUN: 5 mg/dL — ABNORMAL LOW (ref 6–20)
BUN: 6 mg/dL (ref 6–20)
CO2: 22 mmol/L (ref 22–32)
CO2: 25 mmol/L (ref 22–32)
Calcium: 8.1 mg/dL — ABNORMAL LOW (ref 8.9–10.3)
Calcium: 8.6 mg/dL — ABNORMAL LOW (ref 8.9–10.3)
Chloride: 105 mmol/L (ref 98–111)
Chloride: 104 mmol/L (ref 98–111)
Creatinine, Ser: 0.36 mg/dL — ABNORMAL LOW (ref 0.44–1.00)
Creatinine, Ser: 0.48 mg/dL (ref 0.44–1.00)
GFR, Estimated: >60 mL/min (ref >60)
GFR, Estimated: >60 mL/min (ref >60)
Glucose, Bld: 109 mg/dL — ABNORMAL HIGH (ref 70–99)
Glucose, Bld: 127 mg/dL — ABNORMAL HIGH (ref 70–99)
Potassium: 2.9 mmol/L — ABNORMAL LOW (ref 3.5–5.1)
Potassium: 3.8 mmol/L (ref 3.5–5.1)
Sodium: 139 mmol/L (ref 135–145)
Sodium: 137 mmol/L (ref 135–145)

## 2023-04-08 LAB — CBC
HCT: 28.8 % — ABNORMAL LOW (ref 36.0–46.0)
Hemoglobin: 9.6 g/dL — ABNORMAL LOW (ref 12.0–15.0)
MCH: 29.2 pg (ref 26.0–34.0)
MCHC: 33.3 g/dL (ref 30.0–36.0)
MCV: 87.5 fL (ref 80.0–100.0)
Platelets: 136 10*3/uL — ABNORMAL LOW (ref 150–400)
RBC: 3.29 MIL/uL — ABNORMAL LOW (ref 3.87–5.11)
RDW: 15 % (ref 11.5–15.5)
WBC: 5.7 10*3/uL (ref 4.0–10.5)
nRBC: 0 % (ref 0.0–0.2)

## 2023-04-08 LAB — BETA-HYDROXYBUTYRIC ACID: Beta-Hydroxybutyric Acid: 1.41 mmol/L — ABNORMAL HIGH (ref 0.05–0.27)

## 2023-04-08 LAB — MAGNESIUM: Magnesium: 1.7 mg/dL (ref 1.7–2.4)

## 2023-04-08 MED ORDER — INSULIN ASPART 100 UNIT/ML IJ SOLN
0.0000 [IU] | Freq: Three times a day (TID) | INTRAMUSCULAR | Status: DC
  Administered 2023-04-08: 3 [IU] via SUBCUTANEOUS
  Administered 2023-04-09: 2 [IU] via SUBCUTANEOUS

## 2023-04-08 MED ORDER — ONDANSETRON HCL 4 MG/2ML IJ SOLN: 4.0000 mg | Freq: Four times a day (QID) | INTRAMUSCULAR | Status: DC | PRN

## 2023-04-08 MED ORDER — INSULIN ASPART 100 UNIT/ML IJ SOLN: 0.0000 [IU] | Freq: Every day | INTRAMUSCULAR | Status: DC

## 2023-04-08 MED ORDER — NAPHAZOLINE-GLYCERIN 0.012-0.25 % OP SOLN
1.0000 [drp] | Freq: Four times a day (QID) | OPHTHALMIC | Status: DC | PRN
  Filled 2023-04-08: qty 15

## 2023-04-08 MED ORDER — POTASSIUM CHLORIDE CRYS ER 20 MEQ PO TBCR
40.0000 meq | EXTENDED_RELEASE_TABLET | Freq: Once | ORAL | Status: AC
  Administered 2023-04-08: 40 meq via ORAL
  Filled 2023-04-08: qty 2

## 2023-04-08 MED ORDER — METOCLOPRAMIDE HCL 5 MG/ML IJ SOLN: 5.0000 mg | Freq: Four times a day (QID) | INTRAMUSCULAR | Status: DC | PRN

## 2023-04-08 MED ORDER — INSULIN GLARGINE-YFGN 100 UNIT/ML ~~LOC~~ SOLN
15.0000 [IU] | SUBCUTANEOUS | Status: DC
  Administered 2023-04-08 – 2023-04-09 (×2): 15 [IU] via SUBCUTANEOUS
  Filled 2023-04-08 (×3): qty 0.15

## 2023-04-08 MED ORDER — POTASSIUM CHLORIDE 10 MEQ/50ML IV SOLN
10.0000 meq | INTRAVENOUS | Status: AC
  Administered 2023-04-08 (×4): 10 meq via INTRAVENOUS
  Filled 2023-04-08 (×4): qty 50

## 2023-04-08 MED ORDER — INSULIN ASPART 100 UNIT/ML IJ SOLN
3.0000 [IU] | Freq: Three times a day (TID) | INTRAMUSCULAR | Status: DC
  Administered 2023-04-08 – 2023-04-10 (×5): 3 [IU] via SUBCUTANEOUS

## 2023-04-08 MED ORDER — DEXTROSE IN LACTATED RINGERS 5 % IV SOLN: INTRAVENOUS | Status: DC

## 2023-04-08 NOTE — Progress Notes (Signed)
 [facility] ADULT ICU REPLACEMENT PROTOCOL   The patient does apply for the [facility] Adult ICU Electrolyte Replacment Protocol based on the criteria listed below:   1.Exclusion criteria: TCTS, ECMO, Dialysis, and Myasthenia Gravis patients 2. Is GFR >/= 30 ml/min? Yes.    Patient's GFR today is >60 3. Is SCr </= 2? Yes.   Patient's SCr is 0.36 mg/dL 4. Did SCr increase >/= 0.5 in 24 hours? No. 5.Pt's weight >40kg  Yes.   6. Abnormal electrolyte(s): K+ = 2.9  7. Electrolytes replaced per protocol 8.  Call MD STAT for K+ </= 2.5, Phos </= 1, or Mag </= 1 Physician:  Haze, eMD   Shelia Lin Shelia Lin 04/08/2023 1:32 AM

## 2023-04-08 NOTE — Progress Notes (Signed)
 Arterial line was documented as being removed on 04/05/2023 @ 1033.

## 2023-04-08 NOTE — Plan of Care (Signed)
  Problem: Education: Goal: Knowledge of General Education information will improve Description: Including pain rating scale, medication(s)/side effects and non-pharmacologic comfort measures Outcome: Progressing   Problem: Health Behavior/Discharge Planning: Goal: Ability to manage health-related needs will improve Outcome: Progressing   Problem: Clinical Measurements: Goal: Ability to maintain clinical measurements within normal limits will improve Outcome: Progressing Goal: Will remain free from infection Outcome: Progressing Goal: Diagnostic test results will improve Outcome: Progressing Goal: Respiratory complications will improve Outcome: Progressing Goal: Cardiovascular complication will be avoided Outcome: Progressing   Problem: Activity: Goal: Risk for activity intolerance will decrease Outcome: Progressing   Problem: Nutrition: Goal: Adequate nutrition will be maintained Outcome: Progressing   Problem: Coping: Goal: Level of anxiety will decrease Outcome: Progressing   Problem: Elimination: Goal: Will not experience complications related to bowel motility Outcome: Progressing Goal: Will not experience complications related to urinary retention Outcome: Progressing   Problem: Pain Management: Goal: General experience of comfort will improve Outcome: Progressing   Problem: Safety: Goal: Ability to remain free from injury will improve Outcome: Progressing   Problem: Skin Integrity: Goal: Risk for impaired skin integrity will decrease Outcome: Progressing   Problem: Education: Goal: Ability to describe self-care measures that may prevent or decrease complications (Diabetes Survival Skills Education) will improve Outcome: Progressing Goal: Individualized Educational Video(s) Outcome: Progressing   Problem: Coping: Goal: Ability to adjust to condition or change in health will improve Outcome: Progressing   Problem: Fluid Volume: Goal: Ability to  maintain a balanced intake and output will improve Outcome: Progressing   Problem: Health Behavior/Discharge Planning: Goal: Ability to identify and utilize available resources and services will improve Outcome: Progressing Goal: Ability to manage health-related needs will improve Outcome: Progressing   Problem: Metabolic: Goal: Ability to maintain appropriate glucose levels will improve Outcome: Progressing   Problem: Nutritional: Goal: Maintenance of adequate nutrition will improve Outcome: Progressing Goal: Progress toward achieving an optimal weight will improve Outcome: Progressing   Problem: Skin Integrity: Goal: Risk for impaired skin integrity will decrease Outcome: Progressing   Problem: Tissue Perfusion: Goal: Adequacy of tissue perfusion will improve Outcome: Progressing   Problem: Education: Goal: Ability to describe self-care measures that may prevent or decrease complications (Diabetes Survival Skills Education) will improve Outcome: Progressing Goal: Individualized Educational Video(s) Outcome: Progressing   Problem: Cardiac: Goal: Ability to maintain an adequate cardiac output will improve Outcome: Progressing   Problem: Health Behavior/Discharge Planning: Goal: Ability to identify and utilize available resources and services will improve Outcome: Progressing Goal: Ability to manage health-related needs will improve Outcome: Progressing   Problem: Fluid Volume: Goal: Ability to achieve a balanced intake and output will improve Outcome: Progressing   Problem: Metabolic: Goal: Ability to maintain appropriate glucose levels will improve Outcome: Progressing   Problem: Nutritional: Goal: Maintenance of adequate nutrition will improve Outcome: Progressing Goal: Maintenance of adequate weight for body size and type will improve Outcome: Progressing   Problem: Respiratory: Goal: Will regain and/or maintain adequate ventilation Outcome: Progressing    Problem: Urinary Elimination: Goal: Ability to achieve and maintain adequate renal perfusion and functioning will improve Outcome: Progressing

## 2023-04-08 NOTE — Plan of Care (Signed)
  Problem: Education: Goal: Knowledge of General Education information will improve Description: Including pain rating scale, medication(s)/side effects and non-pharmacologic comfort measures Outcome: Not Progressing   Problem: Health Behavior/Discharge Planning: Goal: Ability to manage health-related needs will improve Outcome: Not Progressing   Problem: Clinical Measurements: Goal: Ability to maintain clinical measurements within normal limits will improve Outcome: Not Progressing Goal: Will remain free from infection Outcome: Not Progressing Goal: Diagnostic test results will improve Outcome: Not Progressing Goal: Respiratory complications will improve Outcome: Not Progressing Goal: Cardiovascular complication will be avoided Outcome: Not Progressing   Problem: Activity: Goal: Risk for activity intolerance will decrease Outcome: Not Progressing   Problem: Nutrition: Goal: Adequate nutrition will be maintained Outcome: Not Progressing   Problem: Coping: Goal: Level of anxiety will decrease Outcome: Not Progressing   Problem: Elimination: Goal: Will not experience complications related to bowel motility Outcome: Not Progressing Goal: Will not experience complications related to urinary retention Outcome: Not Progressing   Problem: Pain Management: Goal: General experience of comfort will improve Outcome: Not Progressing   Problem: Safety: Goal: Ability to remain free from injury will improve Outcome: Not Progressing   Problem: Skin Integrity: Goal: Risk for impaired skin integrity will decrease Outcome: Not Progressing   Problem: Education: Goal: Ability to describe self-care measures that may prevent or decrease complications (Diabetes Survival Skills Education) will improve Outcome: Not Progressing Goal: Individualized Educational Video(s) Outcome: Not Progressing   Problem: Coping: Goal: Ability to adjust to condition or change in health will  improve Outcome: Not Progressing   Problem: Fluid Volume: Goal: Ability to maintain a balanced intake and output will improve Outcome: Not Progressing   Problem: Health Behavior/Discharge Planning: Goal: Ability to identify and utilize available resources and services will improve Outcome: Not Progressing Goal: Ability to manage health-related needs will improve Outcome: Not Progressing   Problem: Metabolic: Goal: Ability to maintain appropriate glucose levels will improve Outcome: Not Progressing   Problem: Nutritional: Goal: Maintenance of adequate nutrition will improve Outcome: Not Progressing Goal: Progress toward achieving an optimal weight will improve Outcome: Not Progressing   Problem: Skin Integrity: Goal: Risk for impaired skin integrity will decrease Outcome: Not Progressing   Problem: Tissue Perfusion: Goal: Adequacy of tissue perfusion will improve Outcome: Not Progressing   Problem: Education: Goal: Ability to describe self-care measures that may prevent or decrease complications (Diabetes Survival Skills Education) will improve Outcome: Not Progressing Goal: Individualized Educational Video(s) Outcome: Not Progressing   Problem: Cardiac: Goal: Ability to maintain an adequate cardiac output will improve Outcome: Not Progressing   Problem: Health Behavior/Discharge Planning: Goal: Ability to identify and utilize available resources and services will improve Outcome: Not Progressing Goal: Ability to manage health-related needs will improve Outcome: Not Progressing   Problem: Fluid Volume: Goal: Ability to achieve a balanced intake and output will improve Outcome: Not Progressing   Problem: Metabolic: Goal: Ability to maintain appropriate glucose levels will improve Outcome: Not Progressing   Problem: Nutritional: Goal: Maintenance of adequate nutrition will improve Outcome: Not Progressing Goal: Maintenance of adequate weight for body size and  type will improve Outcome: Not Progressing   Problem: Respiratory: Goal: Will regain and/or maintain adequate ventilation Outcome: Not Progressing   Problem: Urinary Elimination: Goal: Ability to achieve and maintain adequate renal perfusion and functioning will improve Outcome: Not Progressing

## 2023-04-08 NOTE — Evaluation (Signed)
 Occupational Therapy Evaluation/Discharge Patient Details Name: Shelia Lin MRN: [id number] DOB: 1975/09/10 Today's Date: 04/08/2023   History of Present Illness 48 year old F with PMH of intellectual disability, DM2, HLD, obesity, chronic abdominal pain and GERD presented to [facility] ED with flulike symptoms, nausea, vomiting and poor p.o. intake and admitted to ICU with severe DKA, metabolic encephalopathy and possible sepsis.  ABG 7.10/19/55.  CO2 <7, glucose 257, BHA >8.  CT A/P negative for acute abnormalities. Received 5L OF LR and started on mIVF and insulin  gtt and transferred to ICU at [facility].   Clinical Impression   Patient evaluated by Occupational Therapy with no further acute OT needs identified. All education has been completed and the patient has no further questions. Pt lives with parents who are available to provided assistance as needed. Pt is currently at or very close to baseline for BADL tasks and functional mobility. Recommend mobility specialists work with patient while admitted to help increase functional activity tolerance and endurance. No follow-up Occupational Therapy or equipment needs. OT is signing off. Thank you for this referral.        If plan is discharge home, recommend the following: A little help with walking and/or transfers;A little help with bathing/dressing/bathroom;Help with stairs or ramp for entrance;Assistance with cooking/housework    Functional Status Assessment  Patient has not had a recent decline in their functional status  Equipment Recommendations  None recommended by OT    Recommendations for Other Services PT consult     Precautions / Restrictions Precautions Precautions: None Restrictions Weight Bearing Restrictions Per Provider Order: No      Mobility Bed Mobility    General bed mobility comments: up in recliner upon therapy arrival    Transfers Overall transfer level: Needs assistance Equipment used: None Transfers:  Sit to/from Stand, Bed to chair/wheelchair/BSC Sit to Stand: Contact guard assist     Step pivot transfers: Contact guard assist     General transfer comment: CGA provided for safety. Pt was able to walk a short distance in hallway. Once turned around, reported mild dizziness. Seated rest break taken before continuing back to room.      Balance Overall balance assessment: Mild deficits observed, not formally tested         ADL either performed or assessed with clinical judgement   ADL Overall ADL's : At baseline;Modified independent        Vision Baseline Vision/History: 0 No visual deficits Ability to See in Adequate Light: 0 Adequate Patient Visual Report: No change from baseline Vision Assessment?: No apparent visual deficits     Perception Perception: Not tested       Praxis Praxis: Not tested       Pertinent Vitals/Pain Pain Assessment Pain Assessment: No/denies pain     Extremity/Trunk Assessment Upper Extremity Assessment Upper Extremity Assessment: Overall WFL for tasks assessed   Lower Extremity Assessment Lower Extremity Assessment: Defer to PT evaluation   Cervical / Trunk Assessment Cervical / Trunk Assessment: Normal   Communication Communication Communication: Difficulty communicating thoughts/reduced clarity of speech   Cognition Arousal: Alert Behavior During Therapy: WFL for tasks assessed/performed Overall Cognitive Status: History of cognitive impairments - at baseline          General Comments: super sweet. Able to follow all directions during session Sister present and provided background.                Home Living Family/patient expects to be discharged to:: Private residence Living Arrangements: Parent (  lives with parents)        Home Equipment: None   Additional Comments: Enjoys painting, Goes to [employer] day program at [employer]      Prior Functioning/Environment Prior Level of Function : Independent/Modified  Independent;Needs assist  Cognitive Assist : ADLs (cognitive)      Mobility Comments: Used no AD. Independent at baseline. ADLs Comments: Assist with medication and financial management. Able to complete BADL tasks without physical assist.        OT Problem List: Impaired balance (sitting and/or standing)         OT Goals(Current goals can be found in the care plan section) Acute Rehab OT Goals OT Goal Formulation: All assessment and education complete, DC therapy  OT Frequency:  1X       AM-PAC OT 6 Clicks Daily Activity     Outcome Measure Help from another person eating meals?: None Help from another person taking care of personal grooming?: None Help from another person toileting, which includes using toliet, bedpan, or urinal?: None Help from another person bathing (including washing, rinsing, drying)?: None Help from another person to put on and taking off regular upper body clothing?: None Help from another person to put on and taking off regular lower body clothing?: None 6 Click Score: 24   End of Session Nurse Communication: Mobility status  Activity Tolerance: Patient tolerated treatment well Patient left: in chair;with call bell/phone within reach;with family/visitor present  OT Visit Diagnosis: Muscle weakness (generalized) (M62.81)                Time: 8269-8252 OT Time Calculation (min): 17 min Charges:  OT General Charges $OT Visit: 1 Visit OT Evaluation $OT Eval Low Complexity: 1 Low  Leita Howell, OTR/L,CBIS  Supplemental OT - [facility] and [employer] Secure Chat Preferred    Marshall Roehrich, Leita BIRCH 04/08/2023, 6:06 PM

## 2023-04-08 NOTE — Progress Notes (Signed)
 PROGRESS NOTE  Shelia Lin FMW:[id number] DOB: March 04, 1976   PCP: Douglass Gerard DEL, PA-C  Patient is from: Home.  Lives with parents.  DOA: 04/04/2023 LOS: 4  Chief complaints Chief Complaint  Patient presents with   Shortness of Breath   Lethargic     Brief Narrative / Interim history: 48 year old F with PMH of intellectual disability, DM2, HLD, obesity, chronic abdominal pain and GERD presented to [facility] ED with flulike symptoms, nausea, vomiting and poor p.o. intake and admitted to ICU with severe DKA, metabolic encephalopathy and possible sepsis.  ABG 7.10/19/55.  CO2 <7, glucose 257, BHA >8.  CT A/P negative for acute abnormalities. Received 5L OF LR and started on mIVF and insulin  gtt and transferred to ICU at [facility].  Infectious workup including COVID-19, influenza, RSV, 20 pathogen RVP and blood cultures unrevealing.  Patient was continued on insulin  drip, IV fluid, Zosyn  and doxycycline .   Patient was transferred to hospitalist service on 04/08/2023.  She was on insulin  drip but able to transition to subcu insulin .  Subjective: Seen and examined earlier this morning.  No major events overnight of this morning.  Patient's sister at bedside.  Patient has no complaints at the moment.  She is awake but not quite alert.  Fairly oriented.  Denies chest pain, shortness of breath, nausea, vomiting or abdominal pain.  Does not have an appetite.  Objective: Vitals:   04/08/23 0600 04/08/23 0700 04/08/23 0819 04/08/23 1116  BP: 133/84 (!) 141/83    Pulse: 72 69    Resp: 18 16    Temp:   98.4 F (36.9 C) 98.6 F (37 C)  TempSrc:   Oral Oral  SpO2: 95% 97%    Weight:      Height:        Examination:  GENERAL: No apparent distress.  Nontoxic. HEENT: MMM.  Vision and hearing grossly intact.  NECK: Supple.  No apparent JVD.  RESP:  No IWOB.  Fair aeration bilaterally. CVS:  RRR. Heart sounds normal.  ABD/GI/GU: BS+. Abd soft, NTND.  MSK/EXT:  Moves extremities. No apparent  deformity. No edema.  SKIN: no apparent skin lesion or wound NEURO: Awake not quite alert.  Fairly oriented.  Follows commands.  No apparent focal neuro deficit. PSYCH: Calm. Normal affect.   Procedures:  None  Microbiology summarized: COVID-19, influenza, RSV and 20 pathogen RVP nonreactive Blood cultures NGTD  Assessment and plan: Uncontrolled NIDDM-2 with severe DKA and encephalopathy: DKA resolved.  A1c 9.4%.  On Jardiance, metformin and Ozempic at home.  Lives with family but has caregiver who administers medication. Recent Labs  Lab 04/08/23 0819 04/08/23 0922 04/08/23 1019 04/08/23 1115 04/08/23 1232  GLUCAP 121* 142* 154* 145* 162*  -Transition to subcu insulin  with SSI-moderate, NovoLog  3 units 3 times daily with meals and Semglee  15 units daily -Further adjustment as appropriate -Consult diabetic coordinator  Acute metabolic encephalopathy in the setting of severe DKA: Resolving. -Change Compazine  to Reglan  every 6 hours as needed.  Zofran  as needed for refractory nausea and vomiting -Reorientation and delirium precaution -PT/OT eval  Nausea and vomiting: In the setting of uncontrolled diabetes/DKA.  Could have gastroparesis.  Seems to have resolved. -May consider discontinuing Ozempic if this recurs. -Antiemetics as above.  Possible sepsis: No clear source of infection as of now.  Started on IV Zosyn  and doxycycline  in ICU. -Complete course of antibiotics as previously planned -Zosyn . Plan for 5 day course ( end 1/3) -Doxy. Plan for 7 day  course  Hx intellectual disability/mood disorder: Noted. -Continue home Trileptal  and BuSpar    Hypokalemia: -Monitor replenish as appropriate   Morbid obesity: Elevated BMI with comorbidity as above. Body mass index is 36.2 kg/m. -Encourage lifestyle change to lose weight         DVT prophylaxis:  enoxaparin  (LOVENOX ) injection 40 mg Start: 04/05/23 1000 SCDs Start: 04/04/23 2254  Code Status: Full code Family  Communication: Updated patient's sister at bedside Level of care: Med-Surg Status is: Inpatient Remains inpatient appropriate because: Diabetic ketoacidosis, encephalopathy and possible sepsis   Final disposition: Likely home in the next 24 to 48 hours Consultants:  Diabetic coordinator Pulmonology  55 minutes with more than 50% spent in reviewing records, counseling patient/family and coordinating care.   Sch Meds:  Scheduled Meds:  Chlorhexidine  Gluconate Cloth  6 each Topical Daily   enoxaparin  (LOVENOX ) injection  40 mg Subcutaneous Q24H   insulin  aspart  0-15 Units Subcutaneous TID WC   insulin  aspart  0-5 Units Subcutaneous QHS   insulin  aspart  3 Units Subcutaneous TID WC   insulin  glargine-yfgn  15 Units Subcutaneous Q24H   sodium chloride  flush  10-40 mL Intracatheter Q12H   Continuous Infusions:  doxycycline  (VIBRAMYCIN ) IV 100 mg (04/08/23 1242)   insulin  0.4 Units/hr (04/08/23 1037)   piperacillin -tazobactam (ZOSYN )  IV Stopped (04/08/23 0920)   PRN Meds:.acetaminophen , dextrose , hydrALAZINE , metoCLOPramide  (REGLAN ) injection, ondansetron  (ZOFRAN ) IV, sodium chloride  flush  Antimicrobials: Anti-infectives (From admission, onward)    Start     Dose/Rate Route Frequency Ordered Stop   04/04/23 1800  piperacillin -tazobactam (ZOSYN ) IVPB 3.375 g        3.375 g 12.5 mL/hr over 240 Minutes Intravenous Every 8 hours 04/04/23 1342 04/09/23 2159   04/04/23 1645  vancomycin  (VANCOCIN ) 750 mg in sodium chloride  0.9 % 250 mL IVPB        750 mg 265 mL/hr over 60 Minutes Intravenous  Once 04/04/23 1548 04/04/23 1714   04/04/23 1330  doxycycline  (VIBRAMYCIN ) 100 mg in dextrose  5 % 250 mL IVPB        100 mg 125 mL/hr over 120 Minutes Intravenous Every 12 hours 04/04/23 1320 04/11/23 1329   04/04/23 1200  ceFEPIme  (MAXIPIME ) 2 g in sodium chloride  0.9 % 100 mL IVPB        2 g 200 mL/hr over 30 Minutes Intravenous  Once 04/04/23 1155 04/04/23 1240   04/04/23 1200   metroNIDAZOLE  (FLAGYL ) IVPB 500 mg        500 mg 100 mL/hr over 60 Minutes Intravenous  Once 04/04/23 1155 04/04/23 1406   04/04/23 1200  vancomycin  (VANCOCIN ) IVPB 1000 mg/200 mL premix  Status:  Discontinued        1,000 mg 200 mL/hr over 60 Minutes Intravenous  Once 04/04/23 1155 04/04/23 1157   04/04/23 1200  vancomycin  (VANCOCIN ) IVPB 1000 mg/200 mL premix       Placed in Followed by Linked Group   1,000 mg 200 mL/hr over 60 Minutes Intravenous  Once 04/04/23 1157 04/04/23 1509   04/04/23 1200  vancomycin  (VANCOREADY) IVPB 750 mg/150 mL  Status:  Discontinued       Placed in Followed by Linked Group   750 mg 150 mL/hr over 60 Minutes Intravenous  Once 04/04/23 1157 04/04/23 1613        I have personally reviewed the following labs and images: CBC: Recent Labs  Lab 04/04/23 1138 04/04/23 1151 04/05/23 0544 04/06/23 0502 04/07/23 0453 04/08/23 0425  WBC 34.4*  --  17.5* 8.8 6.5 5.7  NEUTROABS 28.8*  --   --   --   --   --   HGB 17.6* 19.4* 14.7 13.4 12.5 9.6*  HCT 53.7* 57.0* 44.0 41.0 36.9 28.8*  MCV 86.2  --  85.8 86.9 85.4 87.5  PLT 442*  --  221 195 175 136*   BMP &GFR Recent Labs  Lab 04/06/23 1157 04/07/23 0002 04/07/23 0453 04/07/23 0520 04/07/23 1219 04/07/23 1552 04/07/23 2217 04/08/23 0051 04/08/23 0425 04/08/23 0830  NA 141   < >  --    < > 140 140 135 139  --  137  K 3.1*   < >  --    < > 3.7 3.5 3.0* 2.9*  --  3.8  CL 110   < >  --    < > 108 107 104 105  --  104  CO2 17*   < >  --    < > 23 24 25 22   --  25  GLUCOSE 155*   < >  --    < > 158* 184* 158* 109*  --  127*  BUN 7   < >  --    < > 6 <5* <5* 5*  --  6  CREATININE 0.63   < >  --    < > 0.42* 0.47 0.40* 0.36*  --  0.48  CALCIUM  9.2   < >  --    < > 8.8* 8.7* 8.6* 8.1*  --  8.6*  MG 2.2  --  2.2  --   --   --   --   --  1.7  --    < > = values in this interval not displayed.   Estimated Creatinine Clearance: 95 mL/min (by C-G formula based on SCr of 0.48 mg/dL). Liver &  Pancreas: Recent Labs  Lab 04/04/23 1138 04/07/23 0453  AST 23 15  ALT 29 19  ALKPHOS 94 51  BILITOT 2.0* 0.9  PROT 9.2* 6.3*  ALBUMIN 5.2* 3.1*   No results for input(s): LIPASE, AMYLASE in the last 168 hours. No results for input(s): AMMONIA in the last 168 hours. Diabetic: Recent Labs    04/06/23 0502  HGBA1C 9.4*   Recent Labs  Lab 04/08/23 0819 04/08/23 0922 04/08/23 1019 04/08/23 1115 04/08/23 1232  GLUCAP 121* 142* 154* 145* 162*   Cardiac Enzymes: Recent Labs  Lab 04/04/23 1138 04/06/23 0502 04/07/23 0453  CKTOTAL 362* 71 44  CKMB  --  3.8  --    No results for input(s): PROBNP in the last 8760 hours. Coagulation Profile: Recent Labs  Lab 04/04/23 1138  INR 1.2   Thyroid Function Tests: No results for input(s): TSH, T4TOTAL, FREET4, T3FREE, THYROIDAB in the last 72 hours. Lipid Profile: No results for input(s): CHOL, HDL, LDLCALC, TRIG, CHOLHDL, LDLDIRECT in the last 72 hours. Anemia Panel: No results for input(s): VITAMINB12, FOLATE, FERRITIN, TIBC, IRON, RETICCTPCT in the last 72 hours. Urine analysis:    Component Value Date/Time   COLORURINE YELLOW 04/05/2023 1016   APPEARANCEUR HAZY (A) 04/05/2023 1016   LABSPEC 1.028 04/05/2023 1016   PHURINE 5.0 04/05/2023 1016   GLUCOSEU >=500 (A) 04/05/2023 1016   HGBUR NEGATIVE 04/05/2023 1016   BILIRUBINUR NEGATIVE 04/05/2023 1016   KETONESUR 20 (A) 04/05/2023 1016   PROTEINUR 100 (A) 04/05/2023 1016   NITRITE NEGATIVE 04/05/2023 1016   LEUKOCYTESUR NEGATIVE 04/05/2023 1016   Sepsis Labs: Invalid input(s): PROCALCITONIN, LACTICIDVEN  Microbiology:  Recent Results (from the past 240 hours)  Resp panel by RT-PCR (RSV, Flu A&B, Covid) Anterior Nasal Swab     Status: None   Collection Time: 04/04/23 11:38 AM   Specimen: Anterior Nasal Swab  Result Value Ref Range Status   SARS Coronavirus 2 by RT PCR NEGATIVE NEGATIVE Final    Comment:  (NOTE) SARS-CoV-2 target nucleic acids are NOT DETECTED.  The SARS-CoV-2 RNA is generally detectable in upper respiratory specimens during the acute phase of infection. The lowest concentration of SARS-CoV-2 viral copies this assay can detect is 138 copies/mL. A negative result does not preclude SARS-Cov-2 infection and should not be used as the sole basis for treatment or other patient management decisions. A negative result may occur with  improper specimen collection/handling, submission of specimen other than nasopharyngeal swab, presence of viral mutation(s) within the areas targeted by this assay, and inadequate number of viral copies(<138 copies/mL). A negative result must be combined with clinical observations, patient history, and epidemiological information. The expected result is Negative.  Fact Sheet for Patients:  bloggercourse.com  Fact Sheet for Healthcare Providers:  seriousbroker.it  This test is no t yet approved or cleared by the [place]  FDA and  has been authorized for detection and/or diagnosis of SARS-CoV-2 by FDA under an Emergency Use Authorization (EUA). This EUA will remain  in effect (meaning this test can be used) for the duration of the COVID-19 declaration under Section 564(b)(1) of the Act, 21 U.S.C.section 360bbb-3(b)(1), unless the authorization is terminated  or revoked sooner.       Influenza A by PCR NEGATIVE NEGATIVE Final   Influenza B by PCR NEGATIVE NEGATIVE Final    Comment: (NOTE) The Xpert Xpress SARS-CoV-2/FLU/RSV plus assay is intended as an aid in the diagnosis of influenza from Nasopharyngeal swab specimens and should not be used as a sole basis for treatment. Nasal washings and aspirates are unacceptable for Xpert Xpress SARS-CoV-2/FLU/RSV testing.  Fact Sheet for Patients: bloggercourse.com  Fact Sheet for Healthcare  Providers: seriousbroker.it  This test is not yet approved or cleared by the [place]  FDA and has been authorized for detection and/or diagnosis of SARS-CoV-2 by FDA under an Emergency Use Authorization (EUA). This EUA will remain in effect (meaning this test can be used) for the duration of the COVID-19 declaration under Section 564(b)(1) of the Act, 21 U.S.C. section 360bbb-3(b)(1), unless the authorization is terminated or revoked.     Resp Syncytial Virus by PCR NEGATIVE NEGATIVE Final    Comment: (NOTE) Fact Sheet for Patients: bloggercourse.com  Fact Sheet for Healthcare Providers: seriousbroker.it  This test is not yet approved or cleared by the [place]  FDA and has been authorized for detection and/or diagnosis of SARS-CoV-2 by FDA under an Emergency Use Authorization (EUA). This EUA will remain in effect (meaning this test can be used) for the duration of the COVID-19 declaration under Section 564(b)(1) of the Act, 21 U.S.C. section 360bbb-3(b)(1), unless the authorization is terminated or revoked.  Performed at [facility], [address] [place], [place] [address]   Blood Culture (routine x 2)     Status: None (Preliminary result)   Collection Time: 04/04/23 11:38 AM   Specimen: BLOOD  Result Value Ref Range Status   Specimen Description   Final    BLOOD RIGHT ANTECUBITAL Performed at [facility], [address], [place], [place] [address]    Special Requests   Final    BOTTLES DRAWN AEROBIC  AND ANAEROBIC Blood Culture adequate volume Performed at [facility], [address] [place], [place] [address]    Culture   Final    NO GROWTH 4 DAYS Performed at [facility], [address] [place], [place] [address]    Report Status PENDING  Incomplete  Blood Culture (routine x 2)     Status: None (Preliminary result)   Collection Time:  04/04/23 11:38 AM   Specimen: BLOOD  Result Value Ref Range Status   Specimen Description   Final    BLOOD LEFT ANTECUBITAL Performed at [facility], [address] [place], [place] [address]    Special Requests   Final    BOTTLES DRAWN AEROBIC AND ANAEROBIC Blood Culture results may not be optimal due to an inadequate volume of blood received in culture bottles Performed at [facility], [address] [place], [place] [address]    Culture   Final    NO GROWTH 4 DAYS Performed at [facility], [address], [place], [place] [address]    Report Status PENDING  Incomplete  MRSA Next Gen by PCR, Nasal     Status: None   Collection Time: 04/04/23  2:59 PM   Specimen: Nasal Mucosa; Nasal Swab  Result Value Ref Range Status   MRSA by PCR Next Gen NOT DETECTED NOT DETECTED Final    Comment: (NOTE) The GeneXpert MRSA Assay (FDA approved for NASAL specimens only), is one component of a comprehensive MRSA colonization surveillance program. It is not intended to diagnose MRSA infection nor to guide or monitor treatment for MRSA infections. Test performance is not FDA approved in patients less than 1 years old. Performed at [facility], [address] [place], [place] [address]   Respiratory (~20 pathogens) panel by PCR     Status: None   Collection Time: 04/05/23  6:03 AM  Result Value Ref Range Status   Adenovirus NOT DETECTED NOT DETECTED Final   Coronavirus 229E NOT DETECTED NOT DETECTED Final    Comment: (NOTE) The Coronavirus on the Respiratory Panel, DOES NOT test for the novel  Coronavirus (2019 nCoV)    Coronavirus HKU1 NOT DETECTED NOT DETECTED Final   Coronavirus NL63 NOT DETECTED NOT DETECTED Final   Coronavirus OC43 NOT DETECTED NOT DETECTED Final   Metapneumovirus NOT DETECTED NOT DETECTED Final   Rhinovirus / Enterovirus NOT DETECTED NOT DETECTED Final   Influenza A NOT DETECTED NOT DETECTED Final   Influenza B  NOT DETECTED NOT DETECTED Final   Parainfluenza Virus 1 NOT DETECTED NOT DETECTED Final   Parainfluenza Virus 2 NOT DETECTED NOT DETECTED Final   Parainfluenza Virus 3 NOT DETECTED NOT DETECTED Final   Parainfluenza Virus 4 NOT DETECTED NOT DETECTED Final   Respiratory Syncytial Virus NOT DETECTED NOT DETECTED Final   Bordetella pertussis NOT DETECTED NOT DETECTED Final   Bordetella Parapertussis NOT DETECTED NOT DETECTED Final   Chlamydophila pneumoniae NOT DETECTED NOT DETECTED Final   Mycoplasma pneumoniae NOT DETECTED NOT DETECTED Final    Comment: Performed at [facility], [address], [place], [place] [address]    Radiology Studies: No results found.    Markanthony Gedney T. Naithen [facility]  If 7PM-7AM, please contact night-coverage [email or web address] 04/08/2023, 1:11 PM

## 2023-04-09 DIAGNOSIS — R6510 Systemic inflammatory response syndrome (SIRS) of non-infectious origin without acute organ dysfunction: Secondary | ICD-10-CM | POA: Diagnosis not present

## 2023-04-09 DIAGNOSIS — R4182 Altered mental status, unspecified: Secondary | ICD-10-CM

## 2023-04-09 DIAGNOSIS — E1110 Type 2 diabetes mellitus with ketoacidosis without coma: Secondary | ICD-10-CM

## 2023-04-09 LAB — CBC
HCT: 37.5 % (ref 36.0–46.0)
Hemoglobin: 12.4 g/dL (ref 12.0–15.0)
MCH: 29.1 pg (ref 26.0–34.0)
MCHC: 33.1 g/dL (ref 30.0–36.0)
MCV: 88 fL (ref 80.0–100.0)
Platelets: 188 10*3/uL (ref 150–400)
RBC: 4.26 MIL/uL (ref 3.87–5.11)
RDW: 14.6 % (ref 11.5–15.5)
WBC: 8.5 10*3/uL (ref 4.0–10.5)
nRBC: 0 % (ref 0.0–0.2)

## 2023-04-09 LAB — CULTURE, BLOOD (ROUTINE X 2)
Culture: NO GROWTH
Culture: NO GROWTH
Special Requests: ADEQUATE

## 2023-04-09 LAB — GLUCOSE, CAPILLARY
Glucose-Capillary: 111 mg/dL — ABNORMAL HIGH (ref 70–99)
Glucose-Capillary: 128 mg/dL — ABNORMAL HIGH (ref 70–99)
Glucose-Capillary: 93 mg/dL (ref 70–99)
Glucose-Capillary: 113 mg/dL — ABNORMAL HIGH (ref 70–99)

## 2023-04-09 LAB — RENAL FUNCTION PANEL
Albumin: 3.1 g/dL — ABNORMAL LOW (ref 3.5–5.0)
Anion gap: 12 (ref 5–15)
BUN: 6 mg/dL (ref 6–20)
CO2: 29 mmol/L (ref 22–32)
Calcium: 8.9 mg/dL (ref 8.9–10.3)
Chloride: 103 mmol/L (ref 98–111)
Creatinine, Ser: 0.51 mg/dL (ref 0.44–1.00)
GFR, Estimated: >60 mL/min (ref >60)
Glucose, Bld: 130 mg/dL — ABNORMAL HIGH (ref 70–99)
Phosphorus: 3.8 mg/dL (ref 2.5–4.6)
Potassium: 3 mmol/L — ABNORMAL LOW (ref 3.5–5.1)
Sodium: 144 mmol/L (ref 135–145)

## 2023-04-09 LAB — MAGNESIUM: Magnesium: 2.1 mg/dL (ref 1.7–2.4)

## 2023-04-09 MED ORDER — OXCARBAZEPINE 300 MG PO TABS
600.0000 mg | ORAL_TABLET | Freq: Every morning | ORAL | Status: DC
  Administered 2023-04-10: 600 mg via ORAL
  Filled 2023-04-09: qty 2

## 2023-04-09 MED ORDER — BUSPIRONE HCL 10 MG PO TABS
20.0000 mg | ORAL_TABLET | Freq: Two times a day (BID) | ORAL | Status: DC
  Administered 2023-04-09 – 2023-04-10 (×3): 20 mg via ORAL
  Filled 2023-04-09 (×2): qty 4
  Filled 2023-04-09: qty 2

## 2023-04-09 MED ORDER — DOXYCYCLINE HYCLATE 100 MG PO TABS
100.0000 mg | ORAL_TABLET | Freq: Two times a day (BID) | ORAL | Status: DC
  Administered 2023-04-09 – 2023-04-10 (×2): 100 mg via ORAL
  Filled 2023-04-09 (×2): qty 1

## 2023-04-09 MED ORDER — SERTRALINE HCL 25 MG PO TABS
25.0000 mg | ORAL_TABLET | Freq: Every day | ORAL | Status: DC
  Administered 2023-04-09 – 2023-04-10 (×2): 25 mg via ORAL
  Filled 2023-04-09 (×2): qty 1

## 2023-04-09 MED ORDER — POTASSIUM CHLORIDE CRYS ER 20 MEQ PO TBCR
40.0000 meq | EXTENDED_RELEASE_TABLET | Freq: Once | ORAL | Status: AC
  Administered 2023-04-09: 40 meq via ORAL
  Filled 2023-04-09: qty 2

## 2023-04-09 MED ORDER — DICYCLOMINE HCL 10 MG PO CAPS
10.0000 mg | ORAL_CAPSULE | Freq: Three times a day (TID) | ORAL | Status: DC
  Administered 2023-04-09 – 2023-04-10 (×2): 10 mg via ORAL
  Filled 2023-04-09 (×2): qty 1

## 2023-04-09 MED ORDER — OXCARBAZEPINE 300 MG PO TABS
900.0000 mg | ORAL_TABLET | Freq: Every day | ORAL | Status: DC
  Administered 2023-04-09: 900 mg via ORAL
  Filled 2023-04-09: qty 3

## 2023-04-09 MED ORDER — ROSUVASTATIN CALCIUM 10 MG PO TABS
40.0000 mg | ORAL_TABLET | Freq: Every day | ORAL | Status: DC
  Administered 2023-04-09 – 2023-04-10 (×2): 40 mg via ORAL
  Filled 2023-04-09 (×2): qty 4

## 2023-04-09 NOTE — Inpatient Diabetes Management (Signed)
 Inpatient Diabetes Program Recommendations  AACE/ADA: New Consensus Statement on Inpatient Glycemic Control (2015)  Target Ranges:  Prepandial:   less than 140 mg/dL      Peak postprandial:   less than 180 mg/dL (1-2 hours)      Critically ill patients:  140 - 180 mg/dL   Lab Results  Component Value Date   GLUCAP 128 (H) 04/09/2023   HGBA1C 9.4 (H) 04/06/2023    Review of Glycemic Control  Diabetes history: DM2 Outpatient Diabetes medications: Jardiance 25 daily, metformin 1000 mg BID, Ozempic 1 mg weekly Current orders for Inpatient glycemic control: Lantus  15 Q24H, Novolog  0-15 TID with meals and 0-5 HS + 3 units TID  HgbA1C - 9.4%  Inpatient Diabetes Program Recommendations:    Discharge Recommendations: Other recommendations: Would not restart SGLT-2 d/t DKA Long acting recommendations: Insulin  Glargine (LANTUS ) Solostar Pen 15 units daily  Supply/Referral recommendations: Referral to Nutrition & Diab Services Pen needles - standard   Use Adult Diabetes Insulin  Treatment Post Discharge order set.  Educated patient and family on insulin  pen use at home. Reviewed contents of insulin  flexpen starter kit. Reviewed all steps if insulin  pen including attachment of needle, 2-unit air shot, dialing up dose, giving injection, removing needle, disposal of sharps, storage of unused insulin , disposal of insulin  etc. Patient able to provide successful return demonstration. Also reviewed troubleshooting with insulin  pen. MD to give patient Rxs for insulin  pens and insulin  pen needles.  Instructed to monitor blood sugars at least 2-3x/day and take logbook to PCP for review. Family to speak with PCP regarding CGM.  Reviewed hypoglycemia s/s and treatment, healthy diet without regular soda,sweetened tea and no juices. Pt states she is going to be more active, walk more and be mindful of portion sizes with carbohydrates.  Answered all questions.  Thank you. Shona Brandy, RD, LDN,  CDCES Inpatient Diabetes Coordinator [phone]

## 2023-04-09 NOTE — Progress Notes (Signed)
 [facility]                                                                              Shelia Lin, is a 48 y.o. female, DOB - 01/25/1976, FMW:[id number] Admit date - 04/04/2023    Outpatient Primary MD for the patient is Douglass, Meghan H, PA-C  LOS - 5  days  Chief Complaint  Patient presents with   Shortness of Breath   Lethargic       Brief summary   Patient is a 48 year old female with intellectual disability, diabetes mellitus type 2, obesity, hyperlipidemia, chronic abdominal pain, GERD presented with flulike symptoms, nausea vomiting, poor p.o. intake.  She was admitted to ICU with severe DKA, metabolic encephalopathy and possible sepsis. ABG 7.10/19/55.  CO2 <7, glucose 257, BHA >8.  CT A/P negative for acute abnormalities. Received 5L OF LR and started on mIVF and insulin  gtt and transferred to ICU at [facility].  Transferred to [facility] hospitalist service on 04/08/2023.  Transitioned to subcu insulin .  Assessment & Plan    Principal Problem: Uncontrolled NIDDM-2 with severe DKA and encephalopathy:  - DKA resolved.  A1c 9.4%.  On Jardiance, metformin and Ozempic at home.  Lives with family but has caregiver who administers medication. -Patient was transitioned to subcutaneous insulin  with Semglee , NovoLog  sliding scale insulin  and meal coverage. -Requested diabetic coordinator for discharge recommendation.  Given DKA, would not resume Jardiance and metformin -Plan to start Lantus  Solostar pen 15 units daily and continue Ozempic.   Acute metabolic encephalopathy in the setting of severe DKA:  -Improved, sister at the bedside, patient is close to her baseline mental status.   -PT/OT eval   Nausea and vomiting: - In the setting of uncontrolled diabetes/DKA.  Could have gastroparesis.  -Symptoms are improving, for now continue Ozempic and if symptoms recur, may consider tapering of Ozempic    Possible sepsis: No clear source of infection as of now.   Started on IV Zosyn  and doxycycline  in ICU. -Complete course of antibiotics as previously planned -Zosyn . Plan for 5 day course ( end 1/3) -Doxy. Plan for 7 day course   Hx intellectual disability/mood disorder: . -Continue home Trileptal  and BuSpar    Hypokalemia: -K 3.0, replaced   Morbid obesity Estimated body mass index is 36.2 kg/m as calculated from the following:   Height as of this encounter: 5' 3 (1.6 m).   Weight as of this encounter: 92.7 kg. Patient is on Ozempic and per sister, has lost 50 lbs  Code Status: Full code DVT Prophylaxis:  enoxaparin  (LOVENOX ) injection 40 mg Start: 04/05/23 1000 SCDs Start: 04/04/23 2254   Level of Care: Level of care: Med-Surg Family Communication: Updated patient's sister at the side Disposition Plan:      Remains inpatient appropriate: Possible DC in a.m.   Procedures:    Consultants:     Antimicrobials:   Anti-infectives (From admission, onward)    Start     Dose/Rate Route Frequency Ordered Stop   04/09/23 1800  doxycycline  (VIBRA -TABS) tablet 100 mg        100 mg Oral Every 12 hours 04/09/23 0857 04/12/23 0959  04/04/23 1800  piperacillin -tazobactam (ZOSYN ) IVPB 3.375 g        3.375 g 12.5 mL/hr over 240 Minutes Intravenous Every 8 hours 04/04/23 1342 04/09/23 2159   04/04/23 1645  vancomycin  (VANCOCIN ) 750 mg in sodium chloride  0.9 % 250 mL IVPB        750 mg 265 mL/hr over 60 Minutes Intravenous  Once 04/04/23 1548 04/04/23 1714   04/04/23 1330  doxycycline  (VIBRAMYCIN ) 100 mg in dextrose  5 % 250 mL IVPB  Status:  Discontinued        100 mg 125 mL/hr over 120 Minutes Intravenous Every 12 hours 04/04/23 1320 04/09/23 0857   04/04/23 1200  ceFEPIme  (MAXIPIME ) 2 g in sodium chloride  0.9 % 100 mL IVPB        2 g 200 mL/hr over 30 Minutes Intravenous  Once 04/04/23 1155 04/04/23 1240   04/04/23 1200  metroNIDAZOLE  (FLAGYL ) IVPB 500 mg        500 mg 100 mL/hr over 60 Minutes Intravenous  Once 04/04/23 1155 04/04/23  1406   04/04/23 1200  vancomycin  (VANCOCIN ) IVPB 1000 mg/200 mL premix  Status:  Discontinued        1,000 mg 200 mL/hr over 60 Minutes Intravenous  Once 04/04/23 1155 04/04/23 1157   04/04/23 1200  vancomycin  (VANCOCIN ) IVPB 1000 mg/200 mL premix       Placed in Followed by Linked Group   1,000 mg 200 mL/hr over 60 Minutes Intravenous  Once 04/04/23 1157 04/04/23 1509   04/04/23 1200  vancomycin  (VANCOREADY) IVPB 750 mg/150 mL  Status:  Discontinued       Placed in Followed by Linked Group   750 mg 150 mL/hr over 60 Minutes Intravenous  Once 04/04/23 1157 04/04/23 1613          Medications  busPIRone   20 mg Oral BID   Chlorhexidine  Gluconate Cloth  6 each Topical Daily   dicyclomine   10 mg Oral TID AC   doxycycline   100 mg Oral Q12H   enoxaparin  (LOVENOX ) injection  40 mg Subcutaneous Q24H   insulin  aspart  0-15 Units Subcutaneous TID WC   insulin  aspart  0-5 Units Subcutaneous QHS   insulin  aspart  3 Units Subcutaneous TID WC   insulin  glargine-yfgn  15 Units Subcutaneous Q24H   [START ON 04/10/2023] oxcarbazepine   600 mg Oral q AM   OXcarbazepine   900 mg Oral QHS   rosuvastatin   40 mg Oral Daily   sertraline   25 mg Oral Daily   sodium chloride  flush  10-40 mL Intracatheter Q12H      Subjective:   Shelia Lin was seen and examined today.  Sitting up in the chair, sister at the bedside, no acute complaints, feeling better.  No chest pain, acute shortness of breath, fevers or chills.  Mental status close to baseline per sister.  Objective:   Vitals:   04/08/23 1358 04/08/23 1923 04/09/23 0501 04/09/23 1232  BP: (!) 149/88 135/83 (!) 132/57 139/86  Pulse: 93 89 86 93  Resp: 20 20 (!) 22   Temp: 99 F (37.2 C) 98.4 F (36.9 C) (!) 97.5 F (36.4 C) 97.9 F (36.6 C)  TempSrc: Oral Oral Oral   SpO2: 96% 95% 97% 92%  Weight:      Height:       No intake or output data in the 24 hours ending 04/09/23 1554   Wt Readings from Last 3 Encounters:  04/04/23  92.7 kg  11/15/22 98.4 kg  10/25/17 113.4  kg     Exam General: Alert and oriented, NAD, close to baseline mental status Cardiovascular: S1 S2 auscultated,  RRR Respiratory: Clear to auscultation bilaterally, no significant wheezing Gastrointestinal: Soft, nontender, nondistended, + bowel sounds Ext: no pedal edema bilaterally Neuro: no new deficits Psych: pleasant    Data Reviewed:  I have personally reviewed following labs    CBC Lab Results  Component Value Date   WBC 8.5 04/09/2023   RBC 4.26 04/09/2023   HGB 12.4 04/09/2023   HCT 37.5 04/09/2023   MCV 88.0 04/09/2023   MCH 29.1 04/09/2023   PLT 188 04/09/2023   MCHC 33.1 04/09/2023   RDW 14.6 04/09/2023   LYMPHSABS 1.9 04/04/2023   MONOABS 2.9 (H) 04/04/2023   EOSABS 0.0 04/04/2023   BASOSABS 0.2 (H) 04/04/2023     Last metabolic panel Lab Results  Component Value Date   NA 144 04/09/2023   K 3.0 (L) 04/09/2023   CL 103 04/09/2023   CO2 29 04/09/2023   BUN 6 04/09/2023   CREATININE 0.51 04/09/2023   GLUCOSE 130 (H) 04/09/2023   GFRNONAA >60 04/09/2023   CALCIUM  8.9 04/09/2023   PHOS 3.8 04/09/2023   PROT 6.3 (L) 04/07/2023   ALBUMIN 3.1 (L) 04/09/2023   BILITOT 0.9 04/07/2023   ALKPHOS 51 04/07/2023   AST 15 04/07/2023   ALT 19 04/07/2023   ANIONGAP 12 04/09/2023    CBG (last 3)  Recent Labs    04/08/23 2043 04/09/23 0723 04/09/23 1134  GLUCAP 104* 111* 128*      Coagulation Profile: Recent Labs  Lab 04/04/23 1138  INR 1.2     Radiology Studies: I have personally reviewed the imaging studies  No results found.     Nydia Distance M.D. [facility] 04/09/2023, 3:54 PM  Available via Epic secure chat 7am-7pm After 7 pm, please refer to night coverage provider listed on amion.

## 2023-04-09 NOTE — Evaluation (Signed)
 Physical Therapy Evaluation Patient Details Name: Shelia Lin MRN: [id number] DOB: January 27, 1976 Today's Date: 04/09/2023  History of Present Illness  48 year old F presented with flulike symptoms, nausea, vomiting and poor p.o. intake and admitted to ICU with severe DKA, metabolic encephalopathy and possible sepsis.  Pt with PMH of intellectual disability, DM2, HLD, obesity, chronic abdominal pain and GERD.  Clinical Impression  Pt is mobilizing well at an independent level. She ambulated 250' without an assistive device, no loss of balance. No further PT indicated, will sign off. Encouraged pt and her sister to ambulate in halls TID to minimize deconditioning during hospitalization.         If plan is discharge home, recommend the following: Supervision due to cognitive status;Assist for transportation   Can travel by private vehicle        Equipment Recommendations None recommended by PT  Recommendations for Other Services       Functional Status Assessment Patient has not had a recent decline in their functional status     Precautions / Restrictions Precautions Precautions: None Precaution Comments: sister stated pt has not had falls in past 6 months Restrictions Weight Bearing Restrictions Per Provider Order: No      Mobility  Bed Mobility Overal bed mobility: Modified Independent             General bed mobility comments: HOB up, used rail    Transfers Overall transfer level: Independent Equipment used: None Transfers: Sit to/from Stand Sit to Stand: Independent                Ambulation/Gait Ambulation/Gait assistance: Independent Gait Distance (Feet): 250 Feet Assistive device: None Gait Pattern/deviations: Decreased stride length, Step-through pattern Gait velocity: decr     General Gait Details: steady, no loss of balance, no dizziness  Stairs            Wheelchair Mobility     Tilt Bed    Modified Rankin (Stroke Patients  Only)       Balance Overall balance assessment: No apparent balance deficits (not formally assessed)                                           Pertinent Vitals/Pain Pain Assessment Pain Assessment: No/denies pain    Home Living Family/patient expects to be discharged to:: Private residence Living Arrangements: Parent (lives with parents) Available Help at Discharge: Family;Available 24 hours/day             Home Equipment: None Additional Comments: Enjoys painting, Goes to [employer] day program at [employer]. Sister provided PLOF information.    Prior Function Prior Level of Function : Independent/Modified Independent;Needs assist  Cognitive Assist : ADLs (cognitive)           Mobility Comments: Used no AD. Independent at baseline. No falls in past 6 months ADLs Comments: Assist with medication and financial management. Able to complete BADL tasks without physical assist.     Extremity/Trunk Assessment   Upper Extremity Assessment Upper Extremity Assessment: Overall WFL for tasks assessed    Lower Extremity Assessment Lower Extremity Assessment: Overall WFL for tasks assessed    Cervical / Trunk Assessment Cervical / Trunk Assessment: Normal  Communication   Communication Communication: Difficulty communicating thoughts/reduced clarity of speech;Hearing impairment (wears hearing aides)  Cognition Arousal: Alert Behavior During Therapy: WFL for tasks assessed/performed Overall Cognitive Status: History of  cognitive impairments - at baseline                                 General Comments: super sweet. Able to follow all directions during session Sister present and provided background.        General Comments      Exercises     Assessment/Plan    PT Assessment Patient does not need any further PT services  PT Problem List         PT Treatment Interventions      PT Goals (Current goals can be found in the Care Plan  section)  Acute Rehab PT Goals PT Goal Formulation: All assessment and education complete, DC therapy    Frequency       Co-evaluation               AM-PAC PT 6 Clicks Mobility  Outcome Measure Help needed turning from your back to your side while in a flat bed without using bedrails?: None Help needed moving from lying on your back to sitting on the side of a flat bed without using bedrails?: None Help needed moving to and from a bed to a chair (including a wheelchair)?: None Help needed standing up from a chair using your arms (e.g., wheelchair or bedside chair)?: None Help needed to walk in hospital room?: None Help needed climbing 3-5 steps with a railing? : None 6 Click Score: 24    End of Session Equipment Utilized During Treatment: Gait belt Activity Tolerance: Patient tolerated treatment well Patient left: in chair;with call bell/phone within reach;with family/visitor present Nurse Communication: Mobility status      Time: 0922-0933 PT Time Calculation (min) (ACUTE ONLY): 11 min   Charges:   PT Evaluation $PT Eval Low Complexity: 1 Low   PT General Charges $$ ACUTE PT VISIT: 1 Visit         Sylvan Delon Copp PT 04/09/2023  Acute Rehabilitation Services  Office [phone]

## 2023-04-09 NOTE — TOC Initial Note (Signed)
 Transition of Care [facility]) - Initial/Assessment Note    Patient Details  Name: Shelia Lin MRN: [id number] Date of Birth: 10-08-1975  [facility] CM/SW Contact:    Sonda Manuella Quill, RN Phone Number: 04/09/2023, 5:13 PM  Clinical Narrative:                 Pt intellectually challenged; spoke w/ pt and parents in room; her POC is mother Shelia Lin [phone]); pt lives at home w/ her parents; they plan for her to return at d/c; her parents verify insurance/PCP; they will also transport pt home; they deny pt experiencing SDOH risk; she does not have DME, HH services, or home oxygen; pt's mother says she attends a day program  thru [employer] of [address] [place] no TOC needs identified; TOC is signing off; please place consult if needed.  Expected Discharge Plan: Home/Self Care Barriers to Discharge: Continued Medical Work up   Patient Goals and CMS Choice Patient states their goals for this hospitalization and ongoing recovery are:: pt will return home per her parents CMS Medicare.gov Compare Post Acute Care list provided to:: Patient Represenative (must comment) Shelia Lin (mother)) Choice offered to / list presented to : Parent      Expected Discharge Plan and Services   Discharge Planning Services: CM Consult   Living arrangements for the past 2 months: Single Family Home                                      Prior Living Arrangements/Services Living arrangements for the past 2 months: Single Family Home Lives with:: Parents Patient language and need for interpreter reviewed:: Yes Do you feel safe going back to the place where you live?: Yes      Need for Family Participation in Patient Care: Yes (Comment) Care giver support system in place?: Yes (comment) Current home services:  (n/a) Criminal Activity/Legal Involvement Pertinent to Current Situation/Hospitalization: No - Comment as needed  Activities of Daily Living   ADL  Screening (condition at time of admission) Independently performs ADLs?: Yes (appropriate for developmental age) Is the patient deaf or have difficulty hearing?: Yes Does the patient have difficulty seeing, even when wearing glasses/contacts?: No Does the patient have difficulty concentrating, remembering, or making decisions?: No  Permission Sought/Granted Permission sought to share information with : Case Manager Permission granted to share information with : Yes, Verbal Permission Granted  Share Information with NAME: Case Manager     Permission granted to share info w Relationship: Hlee Fringer (mother) [phone]     Emotional Assessment Appearance:: Appears stated age Attitude/Demeanor/Rapport: Unable to Assess Affect (typically observed): Unable to Assess Orientation: :  (unable to assess) Alcohol / Substance Use: Not Applicable Psych Involvement: No (comment)  Admission diagnosis:  SIRS (systemic inflammatory response syndrome) (HCC) [R65.10] DKA, type 2 (HCC) [E11.10] Altered mental status, unspecified altered mental status type [R41.82] Diabetic ketoacidosis without coma associated with type 2 diabetes mellitus (HCC) [E11.10] Patient Active Problem List   Diagnosis Date Noted   Hypokalemia 04/06/2023   Encephalopathy acute 04/06/2023   DKA, type 2 (HCC) 04/04/2023   PCP:  Douglass Gerard DEL, PA-C Pharmacy:   [facility] [place], [place] - [address] [place] [address] Phone: [phone] Fax: [phone]     Social Drivers of Health (SDOH) Social History: SDOH Screenings   Food Insecurity:  No Food Insecurity (04/09/2023)  Housing: Low Risk  (04/09/2023)  Transportation Needs: No Transportation Needs (04/09/2023)  Utilities: Not At Risk (04/09/2023)  Tobacco Use: Low Risk  (04/04/2023)   SDOH Interventions: Food Insecurity Interventions: Intervention Not Indicated, Inpatient TOC Housing Interventions: Intervention Not  Indicated, Inpatient TOC Transportation Interventions: Intervention Not Indicated, Inpatient TOC Utilities Interventions: Intervention Not Indicated, Inpatient TOC   Readmission Risk Interventions     No data to display

## 2023-04-10 ENCOUNTER — Other Ambulatory Visit (HOSPITAL_COMMUNITY): Payer: Self-pay

## 2023-04-10 DIAGNOSIS — E1110 Type 2 diabetes mellitus with ketoacidosis without coma: Secondary | ICD-10-CM | POA: Diagnosis not present

## 2023-04-10 DIAGNOSIS — R4182 Altered mental status, unspecified: Secondary | ICD-10-CM | POA: Diagnosis not present

## 2023-04-10 DIAGNOSIS — R6510 Systemic inflammatory response syndrome (SIRS) of non-infectious origin without acute organ dysfunction: Secondary | ICD-10-CM | POA: Diagnosis not present

## 2023-04-10 LAB — GLUCOSE, CAPILLARY: Glucose-Capillary: 112 mg/dL — ABNORMAL HIGH (ref 70–99)

## 2023-04-10 MED ORDER — BLOOD GLUCOSE MONITOR SYSTEM W/DEVICE KIT
1.0000 | PACK | Freq: Three times a day (TID) | 0 refills | Status: AC
  Filled 2023-04-10 (×2): qty 1, 30d supply, fill #0

## 2023-04-10 MED ORDER — PEN NEEDLES 31G X 5 MM MISC
1.0000 | Freq: Three times a day (TID) | 0 refills | Status: AC
  Filled 2023-04-10: qty 100, 33d supply, fill #0

## 2023-04-10 MED ORDER — INSULIN GLARGINE 100 UNIT/ML SOLOSTAR PEN
15.0000 [IU] | PEN_INJECTOR | Freq: Every day | SUBCUTANEOUS | 2 refills | Status: AC
  Filled 2023-04-10: qty 15, 100d supply, fill #0

## 2023-04-10 MED ORDER — TRUEPLUS LANCETS 28G MISC
1.0000 | Freq: Three times a day (TID) | 0 refills | Status: AC
  Filled 2023-04-10 (×2): qty 100, 33d supply, fill #0

## 2023-04-10 MED ORDER — DOXYCYCLINE HYCLATE 100 MG PO TABS
100.0000 mg | ORAL_TABLET | Freq: Two times a day (BID) | ORAL | 0 refills | Status: AC
  Filled 2023-04-10: qty 4, 2d supply, fill #0

## 2023-04-10 MED ORDER — ONDANSETRON HCL 4 MG PO TABS
4.0000 mg | ORAL_TABLET | Freq: Three times a day (TID) | ORAL | 0 refills | Status: AC | PRN
  Filled 2023-04-10: qty 30, 10d supply, fill #0

## 2023-04-10 MED ORDER — BLOOD GLUCOSE TEST VI STRP
1.0000 | ORAL_STRIP | Freq: Three times a day (TID) | 0 refills | Status: AC
  Filled 2023-04-10 (×2): qty 100, 34d supply, fill #0

## 2023-04-10 MED ORDER — LANCET DEVICE MISC
1.0000 | Freq: Three times a day (TID) | 0 refills | Status: AC
  Filled 2023-04-10: qty 1, fill #0

## 2023-04-10 NOTE — Discharge Summary (Signed)
 Physician Discharge Summary   Patient: Shelia Lin MRN: [id number] DOB: [birth year]  Admit date:     04/04/2023  Discharge date: 04/10/23  Discharge Physician: Nydia Distance, MD    PCP: Douglass Gerard DEL, PA-C   Recommendations at discharge:   Doxycycline  100 mg twice daily for 2 more days Started on glargine (Lantus ) Solostar pen, 15 units daily Continue Ozempic 1 mg subcu weekly Discontinued metformin, Jardiance  Discharge Diagnoses:    Uncontrolled NIDDM with severe DKA Acute metabolic encephalopathy secondary to severe DKA Nausea and vomiting in the setting of uncontrolled diabetes Hypokalemia Sepsis, POA, possibly due to [facility]    Hospital Course:  Patient is a 48 year old female with intellectual disability, diabetes mellitus type 2, obesity, hyperlipidemia, chronic abdominal pain, GERD presented with flulike symptoms, nausea vomiting, poor p.o. intake.  She was admitted to ICU with severe DKA, metabolic encephalopathy and possible sepsis. ABG 7.10/19/55.  CO2 <7, glucose 257, BHA >8.  CT A/P negative for acute abnormalities. Received 5L OF LR and started on mIVF and insulin  gtt and transferred to ICU at [facility].  She was subsequently transitioned to subcutaneous insulin   Assessment and Plan:  Uncontrolled NIDDM-2 with severe DKA and encephalopathy:  - DKA resolved.  A1c 9.4%.  On Jardiance, metformin and Ozempic at home.  Lives with family but has caregiver who administers medication. -Patient was transitioned to subcutaneous insulin  with Semglee , NovoLog  sliding scale insulin  and meal coverage. -Patient was closely followed by diabetic coordinator.  Given DKA, would not resume Jardiance and metformin at this time. -Recommended glargine (Lantus ) Solostar pen 15 units daily and continue Ozempic.     Acute metabolic encephalopathy in the setting of severe DKA:  -Improved, sister at the bedside, patient is close to her baseline mental status.   -PT/OT eval completed, no PT  follow-up, patient is ambulating at base line   Nausea and vomiting: - In the setting of uncontrolled diabetes/DKA.  Could have gastroparesis.  -Symptoms are improving, for now continue Ozempic and if symptoms recur, may consider tapering of Ozempic    Possible sepsis, POA: Possibly due to CAP  PNA. -On admission, noted to be tachycardiac, tachypneic, hypertensive, leukocytosis -No clear source of infection as of now.  Started on IV Zosyn  and doxycycline  in ICU. -Respiratory virus panel negative, COVID, RSV, flu negative.  CT abdomen did not show acute abdominal pathology.  Chest x-ray showed nonspecific interstitial prominence consistent with atypical infection edema or interstitial lung disease. -Completed IV Zosyn  on 1/3, continue doxycycline  for 2 more days to complete full course for 7 days    Hx intellectual disability/mood disorder: . -Continue home Trileptal  and BuSpar    Hypokalemia: -K 3.0, replaced   Morbid obesity Estimated body mass index is 36.2 kg/m as calculated from the following:   Height as of this encounter: 5' 3 (1.6 m).   Weight as of this encounter: 92.7 kg. Patient is on Ozempic and per sister, has lost 50 lbs      Pain control - [place]  Controlled Substance Reporting System database was reviewed. and patient was instructed, not to drive, operate heavy machinery, perform activities at heights, swimming or participation in water activities or provide baby-sitting services while on Pain, Sleep and Anxiety Medications; until their outpatient Physician has advised to do so again. Also recommended to not to take more than prescribed Pain, Sleep and Anxiety Medications.  Consultants: Patient was admitted by CCM Procedures performed: None Disposition: Home Diet recommendation:  Discharge Diet Orders (From admission, onward)  Start     Ordered   04/10/23 0000  Diet Carb Modified        04/10/23 0957            DISCHARGE MEDICATION: Allergies as  of 04/10/2023       Reactions   Erythromycin Rash        Medication List     STOP taking these medications    Jardiance 25 MG Tabs tablet Generic drug: empagliflozin   metFORMIN 1000 MG tablet Commonly known as: GLUCOPHAGE       TAKE these medications    Blood Glucose Monitoring Suppl Devi 1 each by Does not apply route 3 (three) times daily. May dispense any manufacturer covered by patient's insurance.   busPIRone  5 MG tablet Commonly known as: BUSPAR  Take 5 mg by mouth in the morning and at bedtime.   busPIRone  15 MG tablet Commonly known as: BUSPAR  Take 15 mg by mouth in the morning and at bedtime.   dicyclomine  10 MG capsule Commonly known as: BENTYL  Take 10 mg by mouth 3 (three) times daily before meals.   doxycycline  100 MG tablet Commonly known as: VIBRA -TABS Take 1 tablet (100 mg total) by mouth 2 (two) times daily for 2 days.   glucose blood test strip 1 each by Other route as needed for other. Use as instructed What changed: Another medication with the same name was added. Make sure you understand how and when to take each.   BLOOD GLUCOSE TEST STRIPS Strp 1 each by Does not apply route 3 (three) times daily. Use as directed to check blood sugar. May dispense any manufacturer covered by patient's insurance and fits patient's device. What changed: You were already taking a medication with the same name, and this prescription was added. Make sure you understand how and when to take each.   insulin  glargine 100 UNIT/ML Solostar Pen Commonly known as: LANTUS  Inject 15 Units into the skin daily. May substitute as needed per insurance.   Lancet Device Misc 1 each by Does not apply route 3 (three) times daily. May dispense any manufacturer covered by patient's insurance.   Lancets Misc 1 each by Does not apply route 3 (three) times daily. Use as directed to check blood sugar. May dispense any manufacturer covered by patient's insurance and fits patient's  device.   ondansetron  4 MG tablet Commonly known as: Zofran  Take 1 tablet (4 mg total) by mouth every 8 (eight) hours as needed for nausea or vomiting.   oxcarbazepine  600 MG tablet Commonly known as: TRILEPTAL  Take 600-900 mg by mouth See admin instructions. Take 600 mg by mouth in the morning and 900 mg at bedtime   Ozempic (1 MG/DOSE) 4 MG/3ML Sopn Generic drug: Semaglutide (1 MG/DOSE) Inject 1 mg into the skin every Friday.   Pen Needles 31G X 5 MM Misc 1 each by Does not apply route 3 (three) times daily. May dispense any manufacturer covered by patient's insurance.   rosuvastatin  40 MG tablet Commonly known as: CRESTOR  Take 40 mg by mouth daily.   sertraline  25 MG tablet Commonly known as: ZOLOFT  Take 25 mg by mouth daily.   TYLENOL  500 MG tablet Generic drug: acetaminophen  Take 1,000 mg by mouth every 6 (six) hours as needed for mild pain (pain score 1-3), headache or fever.        Follow-up Information     Douglass Sayres H, PA-C. Schedule an appointment as soon as possible for a visit in 2 week(s).  Specialty: Physician Assistant Why: for hospital follow-up Contact information: [address] [place] [address] [phone]                Discharge Exam: Fredricka Weights   04/04/23 1132 04/04/23 1530  Weight: 88.9 kg 92.7 kg   S: No acute complaints, pleasant, ambulating in the room, sister at the bedside, looking forward to go home.  BP (!) 142/48 (BP Location: Left Arm)   Pulse 88   Temp 97.6 F (36.4 C)   Resp 17   Ht 5' 3 (1.6 m)   Wt 92.7 kg   SpO2 100%   BMI 36.20 kg/m   Physical Exam General: Alert and oriented x at baseline mental status Cardiovascular: S1 S2 clear, RRR.  Respiratory: CTAB, no wheezing, rales or rhonchi Gastrointestinal: Soft, nontender, nondistended, NBS Ext: no pedal edema bilaterally Neuro: no new deficits, ambulating in the room Psych: pleasant and cooperative   Condition at discharge:  fair  The results of significant diagnostics from this hospitalization (including imaging, microbiology, ancillary and laboratory) are listed below for reference.   Imaging Studies: US  EKG SITE RITE Result Date: 04/06/2023 If Site Rite image not attached, placement could not be confirmed due to current cardiac rhythm.  CT ABDOMEN PELVIS W CONTRAST Result Date: 04/04/2023 CLINICAL DATA:  Shortness of breath and lethargy since yesterday. Vomiting since Thursday. Tachypneic and tachycardic. Abdominal pain. EXAM: CT ABDOMEN AND PELVIS WITH CONTRAST TECHNIQUE: Multidetector CT imaging of the abdomen and pelvis was performed using the standard protocol following bolus administration of intravenous contrast. RADIATION DOSE REDUCTION: This exam was performed according to the departmental dose-optimization program which includes automated exposure control, adjustment of the mA and/or kV according to patient size and/or use of iterative reconstruction technique. CONTRAST:  75mL OMNIPAQUE  IOHEXOL  300 MG/ML  SOLN COMPARISON:  CT abdomen and pelvis 06/30/2022 FINDINGS: Lower chest: No acute abnormality. Hepatobiliary: Hepatic steatosis. Unremarkable gallbladder and biliary tree. Pancreas: Unremarkable. Spleen: Unchanged hypoattenuating lesion in the anterior spleen, likely a cyst or hemangioma. Adrenals/Urinary Tract: Stable adrenal glands. No urinary calculi or hydronephrosis. Unremarkable bladder. Stomach/Bowel: Normal caliber large and small bowel. No bowel wall thickening. Stomach and appendix are within normal limits. Vascular/Lymphatic: No significant vascular findings are present. No enlarged abdominal or pelvic lymph nodes. Reproductive: Status post hysterectomy. No adnexal masses. Other: No free intraperitoneal fluid or air. Musculoskeletal: No acute fracture. IMPRESSION: 1. No acute abdominopelvic abnormality. 2. Hepatic steatosis. Electronically Signed   By: Norman Gatlin M.D.   On: 04/04/2023 13:34    DG Chest Port 1 View Result Date: 04/04/2023 CLINICAL DATA:  Questionable sepsis - evaluate for abnormality EXAM: PORTABLE CHEST - 1 VIEW COMPARISON:  01/10/2018. FINDINGS: The heart size and mediastinal contours are within normal limits. There is diffuse pulmonary interstitial prominence which could be seen with atypical infection, edema or interstitial lung disease. There is no focal consolidation. No pneumothorax or pleural effusion. The visualized osseous structures are unremarkable. IMPRESSION: Nonspecific interstitial prominence consistent with atypical infection, edema or interstitial lung disease. No focal consolidation. Electronically Signed   By: Fonda Field M.D.   On: 04/04/2023 12:36    Microbiology: Results for orders placed or performed during the hospital encounter of 04/04/23  Resp panel by RT-PCR (RSV, Flu A&B, Covid) Anterior Nasal Swab     Status: None   Collection Time: 04/04/23 11:38 AM   Specimen: Anterior Nasal Swab  Result Value Ref Range Status   SARS Coronavirus 2 by RT PCR  NEGATIVE NEGATIVE Final    Comment: (NOTE) SARS-CoV-2 target nucleic acids are NOT DETECTED.  The SARS-CoV-2 RNA is generally detectable in upper respiratory specimens during the acute phase of infection. The lowest concentration of SARS-CoV-2 viral copies this assay can detect is 138 copies/mL. A negative result does not preclude SARS-Cov-2 infection and should not be used as the sole basis for treatment or other patient management decisions. A negative result may occur with  improper specimen collection/handling, submission of specimen other than nasopharyngeal swab, presence of viral mutation(s) within the areas targeted by this assay, and inadequate number of viral copies(<138 copies/mL). A negative result must be combined with clinical observations, patient history, and epidemiological information. The expected result is Negative.  Fact Sheet for Patients:   bloggercourse.com  Fact Sheet for Healthcare Providers:  seriousbroker.it  This test is no t yet approved or cleared by the [place]  FDA and  has been authorized for detection and/or diagnosis of SARS-CoV-2 by FDA under an Emergency Use Authorization (EUA). This EUA will remain  in effect (meaning this test can be used) for the duration of the COVID-19 declaration under Section 564(b)(1) of the Act, 21 U.S.C.section 360bbb-3(b)(1), unless the authorization is terminated  or revoked sooner.       Influenza A by PCR NEGATIVE NEGATIVE Final   Influenza B by PCR NEGATIVE NEGATIVE Final    Comment: (NOTE) The Xpert Xpress SARS-CoV-2/FLU/RSV plus assay is intended as an aid in the diagnosis of influenza from Nasopharyngeal swab specimens and should not be used as a sole basis for treatment. Nasal washings and aspirates are unacceptable for Xpert Xpress SARS-CoV-2/FLU/RSV testing.  Fact Sheet for Patients: bloggercourse.com  Fact Sheet for Healthcare Providers: seriousbroker.it  This test is not yet approved or cleared by the [place]  FDA and has been authorized for detection and/or diagnosis of SARS-CoV-2 by FDA under an Emergency Use Authorization (EUA). This EUA will remain in effect (meaning this test can be used) for the duration of the COVID-19 declaration under Section 564(b)(1) of the Act, 21 U.S.C. section 360bbb-3(b)(1), unless the authorization is terminated or revoked.     Resp Syncytial Virus by PCR NEGATIVE NEGATIVE Final    Comment: (NOTE) Fact Sheet for Patients: bloggercourse.com  Fact Sheet for Healthcare Providers: seriousbroker.it  This test is not yet approved or cleared by the [place]  FDA and has been authorized for detection and/or diagnosis of SARS-CoV-2 by FDA under an Emergency Use  Authorization (EUA). This EUA will remain in effect (meaning this test can be used) for the duration of the COVID-19 declaration under Section 564(b)(1) of the Act, 21 U.S.C. section 360bbb-3(b)(1), unless the authorization is terminated or revoked.  Performed at [facility], [address] [place], [place] [address]   Blood Culture (routine x 2)     Status: None   Collection Time: 04/04/23 11:38 AM   Specimen: BLOOD  Result Value Ref Range Status   Specimen Description   Final    BLOOD RIGHT ANTECUBITAL Performed at [facility], [address] [place], [place] [address]    Special Requests   Final    BOTTLES DRAWN AEROBIC AND ANAEROBIC Blood Culture adequate volume Performed at [facility], [address] [place], [place] [address]    Culture   Final    NO GROWTH 5 DAYS Performed at [facility], [address] [place], [place] [address]    Report Status 04/09/2023 FINAL  Final  Blood Culture (routine x 2)     Status: None   Collection Time: 04/04/23 11:38 AM   Specimen: BLOOD  Result Value Ref Range Status   Specimen Description   Final    BLOOD LEFT ANTECUBITAL Performed at [facility], [address] [place], [place] [address]    Special Requests   Final    BOTTLES DRAWN AEROBIC AND ANAEROBIC Blood Culture results may not be optimal due to an inadequate volume of blood received in culture bottles Performed at [facility], [address] [place], [place] [address]    Culture   Final    NO GROWTH 5 DAYS Performed at [facility], [address] [place], [place] [address]    Report Status 04/09/2023 FINAL  Final  MRSA Next Gen by PCR, Nasal     Status: None   Collection Time: 04/04/23  2:59 PM   Specimen: Nasal Mucosa; Nasal Swab  Result Value Ref Range Status   MRSA by PCR Next Gen NOT DETECTED NOT DETECTED Final    Comment: (NOTE) The GeneXpert MRSA Assay (FDA approved for NASAL  specimens only), is one component of a comprehensive MRSA colonization surveillance program. It is not intended to diagnose MRSA infection nor to guide or monitor treatment for MRSA infections. Test performance is not FDA approved in patients less than 18 years old. Performed at [facility], [address], [place], [place] [address]   Respiratory (~20 pathogens) panel by PCR     Status: None   Collection Time: 04/05/23  6:03 AM  Result Value Ref Range Status   Adenovirus NOT DETECTED NOT DETECTED Final   Coronavirus 229E NOT DETECTED NOT DETECTED Final    Comment: (NOTE) The Coronavirus on the Respiratory Panel, DOES NOT test for the novel  Coronavirus (2019 nCoV)    Coronavirus HKU1 NOT DETECTED NOT DETECTED Final   Coronavirus NL63 NOT DETECTED NOT DETECTED Final   Coronavirus OC43 NOT DETECTED NOT DETECTED Final   Metapneumovirus NOT DETECTED NOT DETECTED Final   Rhinovirus / Enterovirus NOT DETECTED NOT DETECTED Final   Influenza A NOT DETECTED NOT DETECTED Final   Influenza B NOT DETECTED NOT DETECTED Final   Parainfluenza Virus 1 NOT DETECTED NOT DETECTED Final   Parainfluenza Virus 2 NOT DETECTED NOT DETECTED Final   Parainfluenza Virus 3 NOT DETECTED NOT DETECTED Final   Parainfluenza Virus 4 NOT DETECTED NOT DETECTED Final   Respiratory Syncytial Virus NOT DETECTED NOT DETECTED Final   Bordetella pertussis NOT DETECTED NOT DETECTED Final   Bordetella Parapertussis NOT DETECTED NOT DETECTED Final   Chlamydophila pneumoniae NOT DETECTED NOT DETECTED Final   Mycoplasma pneumoniae NOT DETECTED NOT DETECTED Final    Comment: Performed at [facility], [address] [place], [place] [address]    Labs: CBC: Recent Labs  Lab 04/04/23 1138 04/04/23 1151 04/05/23 0544 04/06/23 0502 04/07/23 0453 04/08/23 0425 04/09/23 0321  WBC 34.4*  --  17.5* 8.8 6.5 5.7 8.5  NEUTROABS 28.8*  --   --   --   --   --   --   HGB 17.6*   < > 14.7 13.4 12.5  9.6* 12.4  HCT 53.7*   < > 44.0 41.0 36.9 28.8* 37.5  MCV 86.2  --  85.8 86.9 85.4 87.5 88.0  PLT 442*  --  221 195 175 136* 188   < > = values in this interval  not displayed.   Basic Metabolic Panel: Recent Labs  Lab 04/06/23 1157 04/07/23 0002 04/07/23 0453 04/07/23 0520 04/07/23 1552 04/07/23 2217 04/08/23 0051 04/08/23 0425 04/08/23 0830 04/09/23 0321  NA 141   < >  --    < > 140 135 139  --  137 144  K 3.1*   < >  --    < > 3.5 3.0* 2.9*  --  3.8 3.0*  CL 110   < >  --    < > 107 104 105  --  104 103  CO2 17*   < >  --    < > 24 25 22   --  25 29  GLUCOSE 155*   < >  --    < > 184* 158* 109*  --  127* 130*  BUN 7   < >  --    < > <5* <5* 5*  --  6 6  CREATININE 0.63   < >  --    < > 0.47 0.40* 0.36*  --  0.48 0.51  CALCIUM  9.2   < >  --    < > 8.7* 8.6* 8.1*  --  8.6* 8.9  MG 2.2  --  2.2  --   --   --   --  1.7  --  2.1  PHOS  --   --   --   --   --   --   --   --   --  3.8   < > = values in this interval not displayed.   Liver Function Tests: Recent Labs  Lab 04/04/23 1138 04/07/23 0453 04/09/23 0321  AST 23 15  --   ALT 29 19  --   ALKPHOS 94 51  --   BILITOT 2.0* 0.9  --   PROT 9.2* 6.3*  --   ALBUMIN 5.2* 3.1* 3.1*   CBG: Recent Labs  Lab 04/09/23 0723 04/09/23 1134 04/09/23 1647 04/09/23 2054 04/10/23 0756  GLUCAP 111* 128* 93 113* 112*    Discharge time spent: greater than 30 minutes.  Signed: Nydia Distance, MD [facility] 04/10/2023

## 2023-04-10 NOTE — Progress Notes (Signed)
 PICC line removed per order and with no complications.  Pressure held to achieve hemostasis.  Vaseline/gauze/tegaderm applied.  After care instructions reviewed with patient including signs of infection, dressing care, and activity restrictions.  Patient and family verbalized understanding and had no questions.

## 2023-04-12 ENCOUNTER — Other Ambulatory Visit (HOSPITAL_COMMUNITY): Payer: Self-pay

## 2023-05-08 ENCOUNTER — Emergency Department (HOSPITAL_BASED_OUTPATIENT_CLINIC_OR_DEPARTMENT_OTHER)
Admission: EM | Admit: 2023-05-08 | Discharge: 2023-05-08 | Disposition: A | Discharge status: Home / Self Care | Payer: Medicare Other | Attending: Emergency Medicine | Admitting: Emergency Medicine

## 2023-05-08 ENCOUNTER — Encounter (HOSPITAL_BASED_OUTPATIENT_CLINIC_OR_DEPARTMENT_OTHER): Payer: Self-pay | Admitting: Emergency Medicine

## 2023-05-08 DIAGNOSIS — Z20822 Contact with and (suspected) exposure to covid-19: Secondary | ICD-10-CM | POA: Diagnosis not present

## 2023-05-08 DIAGNOSIS — Z794 Long term (current) use of insulin: Secondary | ICD-10-CM | POA: Insufficient documentation

## 2023-05-08 DIAGNOSIS — R059 Cough, unspecified: Secondary | ICD-10-CM | POA: Diagnosis present

## 2023-05-08 DIAGNOSIS — J069 Acute upper respiratory infection, unspecified: Secondary | ICD-10-CM | POA: Diagnosis not present

## 2023-05-08 LAB — RESP PANEL BY RT-PCR (RSV, FLU A&B, COVID)  RVPGX2
Influenza A by PCR: NEGATIVE
Influenza B by PCR: NEGATIVE
Resp Syncytial Virus by PCR: NEGATIVE
SARS Coronavirus 2 by RT PCR: NEGATIVE

## 2023-05-08 LAB — GROUP A STREP BY PCR: Group A Strep by PCR: NOT DETECTED

## 2023-05-08 NOTE — ED Provider Notes (Signed)
[place] [facility] Provider Note   CSN: [id number] Arrival date & time: 05/08/23  1411     History  Chief Complaint  Patient presents with   Sore Throat    Shelia Lin is a 48 y.o. female with past medical history of DKA, intellectual disability presents to emergency department for evaluation of sore throat, bilateral ear pain, productive cough for past three days.  Father reports that she has taken Mucinex, tylenol, allergy medicine at home without relief and that it normally takes her "a longer amount of time to get better than others". No sick contacts at home, fevers, lethargy.   Sore Throat Pertinent negatives include no chest pain, no abdominal pain, no headaches and no shortness of breath.     Home Medications Prior to Admission medications   Medication Sig Start Date End Date Taking? Authorizing Provider  Blood Glucose Monitoring Suppl (BLOOD GLUCOSE MONITOR SYSTEM) w/Device KIT 1 each by Does not apply route 3 (three) times daily. 04/10/23   Rai, Ripudeep K, MD  busPIRone (BUSPAR) 15 MG tablet Take 15 mg by mouth in the morning and at bedtime.    [provider]  busPIRone (BUSPAR) 5 MG tablet Take 5 mg by mouth in the morning and at bedtime.    [provider]  dicyclomine (BENTYL) 10 MG capsule Take 10 mg by mouth 3 (three) times daily before meals.    [provider]  Glucose Blood (BLOOD GLUCOSE TEST STRIPS) STRP Use as directed to check blood sugar 3 times daily. 04/10/23   Rai, Ripudeep K, MD  glucose blood test strip 1 each by Other route as needed for other. Use as instructed    [provider]  insulin glargine (LANTUS) 100 UNIT/ML Solostar Pen Inject 15 Units into the skin daily. 04/10/23   Rai, Ripudeep K, MD  Insulin Pen Needle (PEN NEEDLES) 31G X 5 MM MISC Use to inject insulin as directed 3 (three) times daily. 04/10/23   Rai, Delene Ruffini, MD  Lancet Device MISC 1 each by Does not apply route 3  (three) times daily. May dispense any manufacturer covered by patient's insurance. 04/10/23   Rai, Ripudeep K, MD  ondansetron (ZOFRAN) 4 MG tablet Take 1 tablet (4 mg total) by mouth every 8 (eight) hours as needed for nausea or vomiting. 04/10/23 04/09/24  Rai, Delene Ruffini, MD  oxcarbazepine (TRILEPTAL) 600 MG tablet Take 600-900 mg by mouth See admin instructions. Take 600 mg by mouth in the morning and 900 mg at bedtime    [provider]  OZEMPIC, 1 MG/DOSE, 4 MG/3ML SOPN Inject 1 mg into the skin every Friday.    [provider]  rosuvastatin (CRESTOR) 40 MG tablet Take 40 mg by mouth daily.    [provider]  sertraline (ZOLOFT) 25 MG tablet Take 25 mg by mouth daily.    [provider]  TRUEplus Lancets 28G MISC 1 each by Does not apply route 3 (three) times daily. Use as directed to check blood sugar. May dispense any manufacturer covered by patient's insurance and fits patient's device. 04/10/23   Rai, Ripudeep K, MD  TYLENOL 500 MG tablet Take 1,000 mg by mouth every 6 (six) hours as needed for mild pain (pain score 1-3), headache or fever.    [provider]      Allergies    Erythromycin    Review of Systems   Review of Systems  Constitutional:  Negative for chills,  fatigue and fever.  HENT:  Positive for sore throat. Negative for trouble swallowing.   Respiratory:  Positive for cough. Negative for chest tightness, shortness of breath and wheezing.   Cardiovascular:  Negative for chest pain and palpitations.  Gastrointestinal:  Negative for abdominal pain, constipation, diarrhea, nausea and vomiting.  Neurological:  Negative for dizziness, seizures, weakness, light-headedness, numbness and headaches.    Physical Exam Updated Vital Signs BP 137/81 (BP Location: Left Arm)   Pulse 99   Temp 98 F (36.7 C) (Oral)   Resp 18   Ht 5' 1.5" (1.562 m)   Wt 93 kg   SpO2 95%   BMI 38.11 kg/m  Physical Exam Vitals and nursing note reviewed.   Constitutional:      General: She is not in acute distress.    Appearance: Normal appearance. She is not ill-appearing.  HENT:     Head: Normocephalic and atraumatic.     Jaw: There is normal jaw occlusion. No trismus, tenderness, swelling or pain on movement.     Comments: No difficulty swallowing nor masses palpated on neck.  No submandibular swelling or tenderness.    Right Ear: Hearing and tympanic membrane normal. No middle ear effusion. Tympanic membrane is not erythematous.     Left Ear: Hearing and tympanic membrane normal.  No middle ear effusion. Tympanic membrane is not erythematous.     Ears:     Comments: Wears hearing aids at baseline.  No tragal nor mastoid tenderness.  Mild injection of ear canal bilaterally however no erythematous TMs bilaterally    Nose: Congestion present.     Right Sinus: No maxillary sinus tenderness or frontal sinus tenderness.     Left Sinus: No maxillary sinus tenderness or frontal sinus tenderness.     Mouth/Throat:     Mouth: No oral lesions.     Dentition: No dental caries.     Pharynx: Uvula midline. Postnasal drip present. No pharyngeal swelling, oropharyngeal exudate, posterior oropharyngeal erythema or uvula swelling.     Tonsils: No tonsillar exudate or tonsillar abscesses. 1+ on the right. 1+ on the left.  Eyes:     Conjunctiva/sclera: Conjunctivae normal.  Neck:     Comments: No meningismus  Cardiovascular:     Rate and Rhythm: Normal rate.  Pulmonary:     Effort: Pulmonary effort is normal. No respiratory distress.     Breath sounds: Normal breath sounds.  Chest:     Chest wall: No tenderness.  Abdominal:     General: There is no distension.     Palpations: Abdomen is soft.     Tenderness: There is no abdominal tenderness. There is no guarding.  Musculoskeletal:     Cervical back: Normal range of motion and neck supple.  Lymphadenopathy:     Cervical: No cervical adenopathy.  Skin:    General: Skin is warm.     Capillary  Refill: Capillary refill takes less than 2 seconds.     Coloration: Skin is not jaundiced or pale.  Neurological:     General: No focal deficit present.     Mental Status: She is alert and oriented to person, place, and time. Mental status is at baseline.    ED Results / Procedures / Treatments   Labs (all labs ordered are listed, but only abnormal results are displayed) Labs Reviewed  GROUP A STREP BY PCR  RESP PANEL BY RT-PCR (RSV, FLU A&B, COVID)  [id number]    EKG None  Radiology No results  found.  Procedures Procedures    Medications Ordered in ED Medications - No data to display  ED Course/ Medical Decision Making/ A&P                                 Medical Decision Making  Patient presents to the ED for concern of sore throat, cough, bilateral ear pain for past 3 days, this involves an extensive number of treatment options, and is a complaint that carries with it a high risk of complications and morbidity.  The differential diagnosis includes viral pharyngitis, strep pharyngitis, sinusitis, AOM, ear effusion, viral URI, laryngitis.  Less likely PTA, retropharyngeal abscess, neoplasm, meningitis   Co morbidities that complicate the patient evaluation  See HPI   Additional history obtained:  Additional history obtained from Family, Nursing, and Outside Medical Records   External records from outside source obtained and reviewed including     Lab Tests:  I Ordered, and personally interpreted labs.  The pertinent results include:   Respiratory panel negative Strep negative    Test Considered:  CBC, bmp Patient is well-appearing.  Physical exam is nonsignificant for alarm symptoms Do not feel this will change tx CXR Lung sounds CTAB.  Low suspicion for pneumonia    Problem List / ED Course:  Viral URI with cough I considered emergent cause such as meningitis, mastoiditis, pneumonia, peritonsillar abscess, retropharyngeal abscess, neoplasm and  admission for patient.  However, I am reassured with no alarm symptoms and reassuring vital signs no alarm symptoms.  Vital signs all within normal limits.  No tachycardia, fever, nor tachypnea noted Strep and flu negative Symptoms likely viral in nature due to onset 3 days ago I discussed ED workup, disposition, return to emergency department cautions with patient's father and patient who expressed understanding agrees to plan. I discussed that patient should follow-up with PCP if symptoms do not improve in 7 to 10 days   Reevaluation:  After the interventions noted above, I reevaluated the patient and found that they have :stayed the same   Social Determinants of Health:  Has PCP follow-up   Dispostion:  After consideration of the diagnostic results and the patients response to treatment, I feel that the patent would benefit from outpatient management PCP follow-up.    Final Clinical Impression(s) / ED Diagnoses Final diagnoses:  Viral URI with cough    Rx / DC Orders ED Discharge Orders     None         Judithann Sheen, PA 05/08/23 1652    Vanetta Mulders, MD 05/09/23 2350

## 2023-05-08 NOTE — Discharge Instructions (Addendum)
Thank you for letting us evaluate you today.  You are negative for COVID, flu, RSV, strep throat.  However, there are multiple viruses that we did not test for that could be causing the symptoms.  You can take Tylenol, ibuprofen for pain and fever.  You can use over-the-counter cough medicine, cough drops, Mucinex for cough symptoms. Chloraseptic, honey, tea - for sore throat  Please return to ED if she experiences difficulty swallowing, shortness of breath, wheezing, chest pain, significant worsening of symptoms, lethargy, altered mentation otherwise you can follow up with PCP in 7 days if symptoms do not improve

## 2023-05-08 NOTE — ED Triage Notes (Signed)
Sore throat and BL ear pain going x 3 days. Patient with hoarse voice.

## 2023-05-08 NOTE — ED Notes (Signed)
Pt alert and oriented X 4 at the time of discharge. RR even and unlabored. No acute distress noted. Legal Guardian and pt verbalized understanding of discharge instructions as discussed. Pt ambulatory to lobby at time of discharge.

## 2023-09-23 ENCOUNTER — Ambulatory Visit: Payer: Self-pay

## 2023-11-07 ENCOUNTER — Emergency Department (HOSPITAL_BASED_OUTPATIENT_CLINIC_OR_DEPARTMENT_OTHER)
Admission: EM | Admit: 2023-11-07 | Discharge: 2023-11-07 | Disposition: A | Discharge status: Home / Self Care | Attending: Emergency Medicine | Admitting: Emergency Medicine

## 2023-11-07 ENCOUNTER — Encounter (HOSPITAL_BASED_OUTPATIENT_CLINIC_OR_DEPARTMENT_OTHER): Payer: Self-pay

## 2023-11-07 ENCOUNTER — Other Ambulatory Visit: Payer: Self-pay

## 2023-11-07 DIAGNOSIS — Z794 Long term (current) use of insulin: Secondary | ICD-10-CM | POA: Insufficient documentation

## 2023-11-07 DIAGNOSIS — H6993 Unspecified Eustachian tube disorder, bilateral: Secondary | ICD-10-CM | POA: Diagnosis not present

## 2023-11-07 DIAGNOSIS — H9203 Otalgia, bilateral: Secondary | ICD-10-CM | POA: Diagnosis present

## 2023-11-07 DIAGNOSIS — H9201 Otalgia, right ear: Secondary | ICD-10-CM

## 2023-11-07 DIAGNOSIS — H9202 Otalgia, left ear: Secondary | ICD-10-CM

## 2023-11-07 NOTE — ED Provider Notes (Signed)
 [place] [facility] Provider Note   CSN: [id number] Arrival date & time: 11/07/23  9042     Patient presents with: Otalgia  Brought in by her father and legal guardian at bedside  Shelia Lin is a 48 y.o. female who presents today for bilateral ear pain, right worse than left.  States pain started yesterday.  Denies any drainage from the ears.  Father at bedside reports she does suffer from allergies and does take Claritin daily to help with this.  Denies any fever, chills, sore throat, cough.  They have tried putting hydrogen peroxide in the ears without improvement in symptoms.    Otalgia      Prior to Admission medications   Medication Sig Start Date End Date Taking? Authorizing Provider  Blood Glucose Monitoring Suppl (BLOOD GLUCOSE MONITOR SYSTEM) w/Device KIT 1 each by Does not apply route 3 (three) times daily. 04/10/23   Rai, Nydia POUR, MD  busPIRone  (BUSPAR ) 15 MG tablet Take 15 mg by mouth in the morning and at bedtime.    [provider]  busPIRone  (BUSPAR ) 5 MG tablet Take 5 mg by mouth in the morning and at bedtime.    [provider]  dicyclomine  (BENTYL ) 10 MG capsule Take 10 mg by mouth 3 (three) times daily before meals.    [provider]  Glucose Blood (BLOOD GLUCOSE TEST STRIPS) STRP Use as directed to check blood sugar 3 times daily. 04/10/23   Rai, Ripudeep K, MD  glucose blood test strip 1 each by Other route as needed for other. Use as instructed    [provider]  insulin  glargine (LANTUS ) 100 UNIT/ML Solostar Pen Inject 15 Units into the skin daily. 04/10/23   Rai, Nydia POUR, MD  Insulin  Pen Needle (PEN NEEDLES) 31G X 5 MM MISC Use to inject insulin  as directed 3 (three) times daily. 04/10/23   Rai, Nydia POUR, MD  Lancet Device MISC 1 each by Does not apply route 3 (three) times daily. May dispense any manufacturer covered by patient's insurance. 04/10/23   Rai, Ripudeep POUR, MD  ondansetron   (ZOFRAN ) 4 MG tablet Take 1 tablet (4 mg total) by mouth every 8 (eight) hours as needed for nausea or vomiting. 04/10/23 04/09/24  Rai, Nydia POUR, MD  oxcarbazepine  (TRILEPTAL ) 600 MG tablet Take 600-900 mg by mouth See admin instructions. Take 600 mg by mouth in the morning and 900 mg at bedtime    [provider]  OZEMPIC, 1 MG/DOSE, 4 MG/3ML SOPN Inject 1 mg into the skin every Friday.    [provider]  rosuvastatin  (CRESTOR ) 40 MG tablet Take 40 mg by mouth daily.    [provider]  sertraline  (ZOLOFT ) 25 MG tablet Take 25 mg by mouth daily.    [provider]  TRUEplus Lancets 28G MISC 1 each by Does not apply route 3 (three) times daily. Use as directed to check blood sugar. May dispense any manufacturer covered by patient's insurance and fits patient's device. 04/10/23   Rai, Nydia POUR, MD  TYLENOL  500 MG tablet Take 1,000 mg by mouth every 6 (six) hours as needed for mild pain (pain score 1-3), headache or fever.    [provider]    Allergies: Erythromycin    Review of Systems  HENT:  Positive for ear pain.     Updated Vital Signs BP (!) 146/95 (BP Location: Right Arm)   Pulse 99   Temp 98 F (36.7 C) (  Oral)   Resp 18   SpO2 96%   Physical Exam Vitals and nursing note reviewed.  Constitutional:      Appearance: Normal appearance.  HENT:     Head: Atraumatic.     Ears:     Comments: Bilateral external auditory canals with chronic appearing scaring and inflammation. No erythema, ear drainage.   TM's without perforation, bulging, or erythema bilaterally  No mastoid tenderness to palpation    Mouth/Throat:     Pharynx: No oropharyngeal exudate or posterior oropharyngeal erythema.  Eyes:     Extraocular Movements: Extraocular movements intact.     Conjunctiva/sclera: Conjunctivae normal.     Pupils: Pupils are equal, round, and reactive to light.  Cardiovascular:     Rate and Rhythm: Normal rate and regular rhythm.   Pulmonary:     Effort: Pulmonary effort is normal.  Lymphadenopathy:     Cervical: No cervical adenopathy.  Neurological:     General: No focal deficit present.     Mental Status: She is alert.  Psychiatric:        Mood and Affect: Mood normal.        Behavior: Behavior normal.     (all labs ordered are listed, but only abnormal results are displayed) Labs Reviewed - No data to display  EKG: None  Radiology: No results found.   Procedures   Medications Ordered in the ED - No data to display                                  Medical Decision Making    Differential diagnosis includes but is not limited to otitis media, otitis externa, eustachian tube dysfunction, perforated TM, mastoiditis, malignant otitis externa  ED Course:  Upon initial evaluation, patient is well-appearing, stable vital signs.  She reports some pain in bilateral ears.  On exam, chronic appearing scarring in the ears bilaterally.  TMs and external auditory canal without any erythema or edema.  No drainage from the ear.  No mastoid tenderness to palpation. No fevers.  Ears do not appear infected at this time, low concern for otitis media or externa.  Father bedside reports she struggles with allergies chronically, suspect she may have eustachian tube dysfunction causing some pain in her ears.  Will continue with home cetirizine daily and add in Flonase. My attending Dr. Patsey also personally evaluated patient and performed ear exam, agrees with plan of care  Impression: Eustachian tube dysfunction  Otalgia, bilateral  Disposition:  The patient was discharged home with instructions to continue taking 10 mg of cetirizine daily.  2 puffs of Flonase to each nostril daily.  Follow-up with PCP if symptoms not improved within the next week Return precautions given.    This chart was dictated using voice recognition software, Dragon. Despite the best efforts of this provider to proofread and correct  errors, errors may still occur which can change documentation meaning.       Final diagnoses:  Otalgia of right ear  Otalgia of left ear  Dysfunction of both eustachian tubes    ED Discharge Orders     None          Veta Palma, PA-C 11/07/23 1047    Patsey Lot, MD 11/08/23 [id number]

## 2023-11-07 NOTE — Discharge Instructions (Addendum)
 Your ears do not appear infected today. Sometimes allergies can lead to a clogged eustachian tube, which is the tube that drains the ear. To treat this, we work to treat allergy symptoms.   Please continue taking 10mg  of cetirizine (Zyrtec) daily.  You may also use 2 puffs of Flonase (fluticasone) in each nostril daily to help with congestion and allergic symptoms.   Please follow-up with your PCP within the next week for recheck of symptoms if not improving  Return to the ER for worsening pain, fevers, any other new or concerning symptoms

## 2023-11-07 NOTE — ED Triage Notes (Signed)
 Pt reports ear pain that started yesterday. Denies drainage or other symptom
